# Patient Record
Sex: Male | Born: 1940 | Race: White | Hispanic: No | Marital: Married | Smoking: Never smoker
Health system: Southern US, Community
[De-identification: ages and names within clinical notes are randomized; demographics above are authoritative.]

## PROBLEM LIST (undated history)

## (undated) DIAGNOSIS — C4491 Basal cell carcinoma of skin, unspecified: Secondary | ICD-10-CM

## (undated) DIAGNOSIS — M1990 Unspecified osteoarthritis, unspecified site: Secondary | ICD-10-CM

## (undated) DIAGNOSIS — E119 Type 2 diabetes mellitus without complications: Secondary | ICD-10-CM

## (undated) DIAGNOSIS — I10 Essential (primary) hypertension: Secondary | ICD-10-CM

## (undated) DIAGNOSIS — E7800 Pure hypercholesterolemia, unspecified: Secondary | ICD-10-CM

## (undated) DIAGNOSIS — G459 Transient cerebral ischemic attack, unspecified: Secondary | ICD-10-CM

## (undated) DIAGNOSIS — N189 Chronic kidney disease, unspecified: Secondary | ICD-10-CM

## (undated) DIAGNOSIS — D126 Benign neoplasm of colon, unspecified: Secondary | ICD-10-CM

## (undated) DIAGNOSIS — C61 Malignant neoplasm of prostate: Secondary | ICD-10-CM

## (undated) DIAGNOSIS — IMO0002 Reserved for concepts with insufficient information to code with codable children: Secondary | ICD-10-CM

## (undated) HISTORY — DX: Basal cell carcinoma of skin, unspecified: C44.91

## (undated) HISTORY — DX: Benign neoplasm of colon, unspecified: D12.6

## (undated) HISTORY — PX: OTHER SURGICAL HISTORY: SHX169

## (undated) HISTORY — PX: EYE SURGERY: SHX253

## (undated) HISTORY — PX: PROSTATECTOMY: SHX69

---

## 2006-06-22 ENCOUNTER — Ambulatory Visit: Payer: Self-pay | Admitting: Rheumatology

## 2006-09-08 ENCOUNTER — Ambulatory Visit: Payer: Self-pay | Admitting: Unknown Physician Specialty

## 2007-11-19 ENCOUNTER — Ambulatory Visit: Payer: Self-pay | Admitting: Internal Medicine

## 2008-04-03 ENCOUNTER — Ambulatory Visit: Payer: Self-pay | Admitting: Internal Medicine

## 2008-05-29 ENCOUNTER — Ambulatory Visit: Payer: Self-pay | Admitting: Rheumatology

## 2008-06-05 ENCOUNTER — Ambulatory Visit (HOSPITAL_COMMUNITY): Admission: RE | Admit: 2008-06-05 | Discharge: 2008-06-05 | Payer: Self-pay | Admitting: Neurosurgery

## 2009-10-20 ENCOUNTER — Ambulatory Visit: Payer: Self-pay | Admitting: Internal Medicine

## 2010-04-16 ENCOUNTER — Ambulatory Visit: Payer: Self-pay | Admitting: Unknown Physician Specialty

## 2011-01-18 ENCOUNTER — Encounter: Payer: Self-pay | Admitting: Physician Assistant

## 2011-02-05 ENCOUNTER — Encounter: Payer: Self-pay | Admitting: Physician Assistant

## 2011-03-07 ENCOUNTER — Encounter: Payer: Self-pay | Admitting: Physician Assistant

## 2011-08-26 ENCOUNTER — Other Ambulatory Visit: Payer: Self-pay | Admitting: Rheumatology

## 2011-08-26 LAB — SYNOVIAL CELL COUNT + DIFF, W/ CRYSTALS
Lymphocytes: 33 %
Neutrophils: 30 %
Nucleated Cell Count: 249 /mm3
Other Cells BF: 0 %

## 2011-09-17 ENCOUNTER — Ambulatory Visit: Payer: Self-pay | Admitting: Rheumatology

## 2012-09-11 ENCOUNTER — Ambulatory Visit: Payer: Self-pay | Admitting: Otolaryngology

## 2012-09-11 DIAGNOSIS — I10 Essential (primary) hypertension: Secondary | ICD-10-CM

## 2012-09-11 LAB — CBC WITH DIFFERENTIAL/PLATELET
Lymphocyte %: 21.6 %
MCHC: 33.6 g/dL (ref 32.0–36.0)
Neutrophil #: 2.2 10*3/uL (ref 1.4–6.5)
Neutrophil %: 64.8 %
RBC: 5.16 10*6/uL (ref 4.40–5.90)
RDW: 13.4 % (ref 11.5–14.5)

## 2012-09-11 LAB — BASIC METABOLIC PANEL
BUN: 24 mg/dL — ABNORMAL HIGH (ref 7–18)
Calcium, Total: 8.8 mg/dL (ref 8.5–10.1)
Creatinine: 1.43 mg/dL — ABNORMAL HIGH (ref 0.60–1.30)
EGFR (Non-African Amer.): 49 — ABNORMAL LOW
Osmolality: 285 (ref 275–301)

## 2012-09-12 ENCOUNTER — Ambulatory Visit: Payer: Self-pay | Admitting: Otolaryngology

## 2013-10-23 DIAGNOSIS — N182 Chronic kidney disease, stage 2 (mild): Secondary | ICD-10-CM

## 2013-10-23 DIAGNOSIS — E7800 Pure hypercholesterolemia, unspecified: Secondary | ICD-10-CM | POA: Insufficient documentation

## 2013-10-23 DIAGNOSIS — E1122 Type 2 diabetes mellitus with diabetic chronic kidney disease: Secondary | ICD-10-CM | POA: Insufficient documentation

## 2013-10-23 DIAGNOSIS — I10 Essential (primary) hypertension: Secondary | ICD-10-CM | POA: Insufficient documentation

## 2014-09-26 NOTE — Op Note (Signed)
PATIENT NAME:  James Watson, James Watson MR#:  [id number] DATE OF BIRTH:  Oct 30, 1940  DATE OF PROCEDURE:  09/12/2012  PREOPERATIVE DIAGNOSIS: Left conductive hearing loss.   POSTOPERATIVE DIAGNOSIS: Left conductive hearing loss.    PROCEDURE: Left middle ear exploration with tympanoplasty and ossicular chain reconstruction.   SURGEON: Malon Kindle, MD   ANESTHESIA: General endotracheal.   INDICATIONS: The 74 year old male with a history of a previous left ear surgery of uncertain extent many years ago. 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   FINDINGS: Middle ear was explored and  was found to have erosion of the incus, but an intact and mobile stapes. I suspect they previously attempted a type 3 tympanoplasty which had failed. The ossicular chain was reconstructed with a with a [employer] prosthesis, catalogue [phone].  COMPLICATIONS: None.   DESCRIPTION OF PROCEDURE: After informed consent, the patient was taken to the operating room and placed in the supine position. After induction of general endotracheal anesthesia, the left ear was injected in the canal and post-auricularly with 1% lidocaine with epinephrine 1:200,000.  He was then prepped and draped in the usual sterile fashion. Left ear was then evaluated under the operating microscope. Canal flap was made, incising the canal skin at 12 o'clock and 6 o'clock and carrying the incision out 4 to 5 mm away from the annulus. A horizontal incision was made connecting the 2 vertical incisions and the canal flap was elevated down to the annulus. The annulus was carefully elevated with a Rosen needle. I watched carefully for the chorda tympani nerve, but no nerve was identified and the likelihood been likely had been sacrificed during the previous surgery. There were some adhesions which were carefully taken down with incudostapedial joint knife, but immediately noted that the  stapes was in place with no evidence of any previous prosthesis. Residual nub of the residual incus could be seen up towards the attic. The malleus was intact. After taking the adhesions down, the stapes was palpated and inspected for mobility and appeared to be fully mobile. A [employer]us prosthesis was selected and trimmed to fit after measuring the depth to the shoulder of the stapes. In attempting to place the prosthesis, the tympanic membrane in that area was very thin and developed a small tear while trying to push the prosthesis up under the malleus. A small amount of perichondrium was harvested from just inside the tragus, pressed and dried and then placed to attach the small perforation, and the incus prosthesis was then placed into position, sliding it up under the malleus and seating it over the head of the stapes. The malleus was palpated and good movement of the prosthesis and stapes unit was noted. The tympanic membrane graft was held  into position by the prosthesis itself and no middle ear packing was necessary. The perforation was quite small, maybe 2 mm. The tympanic membrane and the posterior canal flap were placed back in to anatomic position and secured with Floxin moistened Gelfoam packing, followed by bacitracin ointment. The tragal wound was closed with 5-0 fast gut suture in an interrupted fashion. The patient was then returned to the anesthesiologist for awakening. He was awakened and taken to the recovery room in good condition postoperatively.   BLOOD LOSS:  Minimal.     ____________________________ Sammuel Hines. Richardson Landry, MD psb:cc D: 09/12/2012 14:48:17 ET T: 09/12/2012 16:48:59 ET JOB#: [id number]  cc: Sammuel Hines. Richardson Landry, MD, <Dictator> Sammuel Hines  Shane Badeaux MD ELECTRONICALLY SIGNED 09/19/2012 8:35

## 2014-10-10 ENCOUNTER — Other Ambulatory Visit: Payer: Self-pay | Admitting: Nephrology

## 2014-10-10 DIAGNOSIS — N184 Chronic kidney disease, stage 4 (severe): Secondary | ICD-10-CM

## 2014-10-16 ENCOUNTER — Ambulatory Visit
Admission: RE | Admit: 2014-10-16 | Discharge: 2014-10-16 | Disposition: A | Discharge status: Home / Self Care | Payer: Medicare Other | Source: Ambulatory Visit | Attending: Nephrology | Admitting: Nephrology

## 2014-10-16 DIAGNOSIS — N184 Chronic kidney disease, stage 4 (severe): Secondary | ICD-10-CM

## 2014-10-16 DIAGNOSIS — N281 Cyst of kidney, acquired: Secondary | ICD-10-CM | POA: Insufficient documentation

## 2015-02-27 ENCOUNTER — Other Ambulatory Visit: Payer: Self-pay | Admitting: Family Medicine

## 2015-02-27 DIAGNOSIS — M25512 Pain in left shoulder: Secondary | ICD-10-CM

## 2015-03-04 ENCOUNTER — Ambulatory Visit
Admission: RE | Admit: 2015-03-04 | Discharge: 2015-03-04 | Disposition: A | Discharge status: Home / Self Care | Payer: Medicare Other | Source: Ambulatory Visit | Attending: Family Medicine | Admitting: Family Medicine

## 2015-03-04 DIAGNOSIS — M25512 Pain in left shoulder: Secondary | ICD-10-CM

## 2015-03-07 ENCOUNTER — Other Ambulatory Visit: Payer: Medicare Other

## 2015-05-25 ENCOUNTER — Encounter: Payer: Medicare Other | Attending: Family Medicine | Admitting: Dietician

## 2015-05-25 VITALS — Ht 65.0 in | Wt 175.0 lb

## 2015-05-25 DIAGNOSIS — N289 Disorder of kidney and ureter, unspecified: Secondary | ICD-10-CM | POA: Diagnosis not present

## 2015-05-25 DIAGNOSIS — N182 Chronic kidney disease, stage 2 (mild): Secondary | ICD-10-CM

## 2015-05-25 DIAGNOSIS — E119 Type 2 diabetes mellitus without complications: Secondary | ICD-10-CM | POA: Insufficient documentation

## 2015-05-25 DIAGNOSIS — E785 Hyperlipidemia, unspecified: Secondary | ICD-10-CM | POA: Diagnosis present

## 2015-05-25 DIAGNOSIS — E1122 Type 2 diabetes mellitus with diabetic chronic kidney disease: Secondary | ICD-10-CM

## 2015-05-25 DIAGNOSIS — I10 Essential (primary) hypertension: Secondary | ICD-10-CM | POA: Insufficient documentation

## 2015-05-25 NOTE — Patient Instructions (Signed)
Balance meals with protein, 2-4 servings of carbohydrate and "free vegetables". Estimate carbohydrate servings at meals and snacks. Spread 11 servings of carbohydrate over 3 meals and 1-2 snacks. Measure some portions of carbohydrate foods, especially starches. Read labels for carbohydrate grams remembering that 15 gms of carbohydrate equals one serving. Limit fruit to 1 (15gm) serving per meal. Limit sodium to 1500 mg per day or an average of no more than 500mg  per meal. Protein recommendations- approximately 6 oz. of a protein food/day. Refer to list.

## 2015-05-26 NOTE — Progress Notes (Signed)
Medical Nutrition Therapy: Visit start time: [id number]  end time: 1650 Assessment:  Diagnosis: Type 2 diabetes, hypertension, hyperlipidemia, renal disease Past medical history: shoulder surgery 2 weeks ago  Psychosocial issues/ stress concerns: Patient rates his stress level as "low" and indicates "very well" as to how he is dealing with his stress. Preferred learning method:  James Watson . Hands-on Current weight: 175 lbs Height: 65 in  Medications, supplements: see list  Progress and evaluation:  Patient in for initial nutrition assessment. Patient reports that he made significant positive diet changes approximately 1 year ago and his HgA1c improved but in past 4 months A1c has increased from 6.7 to 7.3. His GFR has decreased from 50 to 42 in past year. Regarding his lipid panel (12/'16), LDL was less than 100 (95) and total cholesterol was less than 200 (176) but triglycerides were 160.  Patient reports his fasting blood sugars range from 140's to 180's. He began taking glimepiride approx. 2 weeks ago. He eats 3 meals per day with very little snacking between meals. Reports that his weight had remained relatively stable in past year but has increased recently. Reports that his wife prepares meals and is "health conscious"; most meats are baked or grilled; dinner meals include a variety of vegetables. He and wife typically share entrees when dining "out".  He reports he has become more conscious of sodium intake and does not add any salt to foods. He does not drink sugar sweetened beverages and loves sweets but limits his intake Physical activity: decreased physical activity in recent weeks with shoulder pain and subsequent surgery 2 weeks ago.   Dietary Intake:  Usual eating pattern includes 3 meals and 0-1 snacks per day. Dining out frequency: 2-4 meals per week.  Breakfast: 1/2 large bagel or 1/2 English muffin, 1 egg, 2 strips bacon or 1 sausage pattie, large bowl of fruit or large bowl of  cereal/walnuts/skim milk and large bowl of fruit Snack: occasionally handful of cheese crackers Lunch: 12:00- salad with scoop of chicken salad, diet soda or 1/2 ham or [place] sandwich with a salad or when "out" a chicken taco or fish taco Supper: grilled chicken or pork chop, 1/2 sweet potato or occasionally 1/2 white potato with salad or other vegetables such as broccoli or turnip greens or asparagus, hot tea. Typically eats a serving of fruit rather than dessert. Prior to diet changes 1 year ago, would eat a dessert after every dinner meal. Beverages: water, hot tea, diet soda  Nutrition Care Education: Diabetes:  Instructed on a meal plan based on 1600 calories including carbohydrate counting, discussing the balance of carbohydrate foods, protein and non-starchy vegetables. Discussed portion control and basic label reading. Gave and reviewed menu examples. Renal disease:  Instructed on guidelines for sodium recommendations and ways to lower sodium in diet. Also, discussed the need for adequate protein, yet not excessive to help protect kidneys.  Nutritional Diagnosis:  NB-2.1 Physical inactivity As related to shoulder pain and subsequent surgery.  As evidenced by diet/activity history.  Patient's meal pattern and choices have remained relatively consistent over the past year but activity has decreased which is possibly contributing to some weight gain and elevated glucose.  Intervention:  Balance meals with protein, 2-4 servings of carbohydrate and "free vegetables". Estimate carbohydrate servings at meals and snacks. Spread 11 servings of carbohydrate over 3 meals and 1-2 snacks. Measure some portions of carbohydrate foods, especially starches. Read labels for carbohydrate grams remembering that 15 gms of carbohydrate equals one  serving. Limit fruit to 1 (15gm) serving per meal. Limit sodium to 1500 mg per day or an average of no more than 500mg  per meal. Protein recommendations-  approximately 6 oz. of a protein food/day. Ref Product/process development scientist given:  . Food lists/ Planning A Balanced Meal . Sample meal pattern/ menus . Goals/ instructions  Learner/ who was taught:  . Patient  Level of understanding: . Partial understanding; needs review/ practice Learning barriers: . None Willingness to learn/ readiness for change: . Acceptance, ready for change  Monitoring and Evaluation:  Dietary intake, exercise,  and body weight      follow up: 06/23/15 at 3:30

## 2015-06-18 DIAGNOSIS — D126 Benign neoplasm of colon, unspecified: Secondary | ICD-10-CM

## 2015-06-18 DIAGNOSIS — Z86010 Personal history of colonic polyps: Secondary | ICD-10-CM | POA: Insufficient documentation

## 2015-06-18 HISTORY — DX: Benign neoplasm of colon, unspecified: D12.6

## 2015-06-23 ENCOUNTER — Encounter: Payer: Medicare Other | Attending: Family Medicine | Admitting: Dietician

## 2015-06-23 ENCOUNTER — Encounter: Payer: Self-pay | Admitting: Dietician

## 2015-06-23 VITALS — Ht 65.0 in | Wt 174.2 lb

## 2015-06-23 DIAGNOSIS — E119 Type 2 diabetes mellitus without complications: Secondary | ICD-10-CM | POA: Diagnosis not present

## 2015-06-23 DIAGNOSIS — E785 Hyperlipidemia, unspecified: Secondary | ICD-10-CM

## 2015-06-23 DIAGNOSIS — N289 Disorder of kidney and ureter, unspecified: Secondary | ICD-10-CM | POA: Insufficient documentation

## 2015-06-23 DIAGNOSIS — I10 Essential (primary) hypertension: Secondary | ICD-10-CM | POA: Insufficient documentation

## 2015-06-23 NOTE — Patient Instructions (Signed)
Continue with previous goals: Balance meals with protein, 2-4 servings of carbohydrate and "free vegetables". Estimate carbohydrate servings at meals and snacks. Spread 11 servings of carbohydrate over 3 meals and 1-2 snacks. Measure some portions of carbohydrate foods, especially starches. Read labels for carbohydrate grams remembering that 15 gms of carbohydrate equals one serving. Limit fruit to 1 (15gm) serving per meal. Limit sodium to 1500 mg per day or an average of no more than 500mg  per meal. Protein recommendations- approximately 6 oz. of a protein food/day.   Suggested to use calorieking app to look up nutrition information when dining "out".

## 2015-06-23 NOTE — Progress Notes (Signed)
Medical Nutrition Therapy Follow-up visit:  Time with patient:  15:30-16:30 Visit #:2 ASSESSMENT:  Diagnosis:Type 2 diabetes, hypertension, hyperlipidemia, renal disease  Current weight:174.2 lbs    Height:65 in Medications: See list Progress and evaluation: Patient accompanied by his wife in for nutrition follow-up visit. He has followed through to better control portions. At breakfast he is substituting oatmeal with walnuts on some mornings in place of cereal and milk. He is limiting fruit to 1 cup of mixed fruits vs. previous 2 cups+ at breakfast. He also reports he is being more conscious of carbohydrate servings. He also is reading labels for sodium content. His beverages continue to be water (drinks throughout day), hot tea, 2 (12oz) cans of diet soda/day and 2 (4oz) glasses of wine/day. His diet continues to be low in saturated and trans fat.  He reports that FBG's are in 120's to 130's range vs. 140's to 180's range. He also was able to keep weight stable over the holidays, weighing 1 lb less than at previous visit. He has also been able to increase exercise. He does stretches daily and walks on treadmill for 45 minutes, 3-4 days/week.   NUTRITION CARE EDUCATION: Diabetes:  Commended on the overall positive changes in diet and exercise and the improvements in his fasting glucose. Reviewed his typical meal pattern, answering he and wife's questions regarding specific foods and portions.Also, discussed making healthy choices when dining "out". Reviewed again the role of diet and exercise in glucose control.  Renal disease and hypertension:Reviewed sodium guidelines discussing how to balance higher sodium foods with low sodium foods. Also, discussed importance of fruits/vegetables in blood pressure control.  INTERVENTION:  Continue with previous goals: Balance meals with protein, 2-4 servings of carbohydrate and "free vegetables". Estimate carbohydrate servings at meals and  snacks. Spread 11 servings of carbohydrate over 3 meals and 1-2 snacks. Measure some portions of carbohydrate foods, especially starches. Read labels for carbohydrate grams remembering that 15 gms of carbohydrate equals one serving. Limit fruit to 1 (15gm) serving per meal. Limit sodium to 1500 mg per day or an average of no more than 500mg  per meal. Protein recommendations- approximately 6 oz. of a protein food/day.   Suggested to use calorieking app to look up nutrition information when dining "out".  EDUCATION MATERIALS GIVEN:  . Goals/ instructions  LEARNER/ who was taught:  . Patient  . Spouse/ partner  LEVEL OF UNDERSTANDING: . Verbalizes/ demonstrates competency LEARNING BARRIERS: . None  WILLINGNESS TO LEARN/READINESS FOR CHANGE: . Eager, change in progress MONITORING AND EVALUATION:  No follow-up was scheduled at this time. Encouraged he and/or wife to call if has further questions or desires further help with diet/nutrition.

## 2015-07-15 ENCOUNTER — Encounter: Payer: Self-pay | Admitting: *Deleted

## 2015-07-16 ENCOUNTER — Ambulatory Visit
Admission: RE | Admit: 2015-07-16 | Discharge: 2015-07-16 | Disposition: A | Discharge status: Home / Self Care | Payer: Medicare Other | Source: Ambulatory Visit | Attending: Unknown Physician Specialty | Admitting: Unknown Physician Specialty

## 2015-07-16 ENCOUNTER — Ambulatory Visit: Payer: Medicare Other | Admitting: Anesthesiology

## 2015-07-16 ENCOUNTER — Encounter
Admission: RE | Disposition: A | Discharge status: Home / Self Care | Payer: Self-pay | Source: Ambulatory Visit | Attending: Unknown Physician Specialty

## 2015-07-16 DIAGNOSIS — Z886 Allergy status to analgesic agent status: Secondary | ICD-10-CM | POA: Insufficient documentation

## 2015-07-16 DIAGNOSIS — N189 Chronic kidney disease, unspecified: Secondary | ICD-10-CM | POA: Diagnosis not present

## 2015-07-16 DIAGNOSIS — Z1211 Encounter for screening for malignant neoplasm of colon: Secondary | ICD-10-CM | POA: Diagnosis present

## 2015-07-16 DIAGNOSIS — Z85828 Personal history of other malignant neoplasm of skin: Secondary | ICD-10-CM | POA: Insufficient documentation

## 2015-07-16 DIAGNOSIS — K640 First degree hemorrhoids: Secondary | ICD-10-CM | POA: Diagnosis not present

## 2015-07-16 DIAGNOSIS — E119 Type 2 diabetes mellitus without complications: Secondary | ICD-10-CM | POA: Diagnosis not present

## 2015-07-16 DIAGNOSIS — I129 Hypertensive chronic kidney disease with stage 1 through stage 4 chronic kidney disease, or unspecified chronic kidney disease: Secondary | ICD-10-CM | POA: Insufficient documentation

## 2015-07-16 DIAGNOSIS — Z888 Allergy status to other drugs, medicaments and biological substances status: Secondary | ICD-10-CM | POA: Diagnosis not present

## 2015-07-16 DIAGNOSIS — D123 Benign neoplasm of transverse colon: Secondary | ICD-10-CM | POA: Insufficient documentation

## 2015-07-16 DIAGNOSIS — M1990 Unspecified osteoarthritis, unspecified site: Secondary | ICD-10-CM | POA: Diagnosis not present

## 2015-07-16 DIAGNOSIS — Z885 Allergy status to narcotic agent status: Secondary | ICD-10-CM | POA: Insufficient documentation

## 2015-07-16 DIAGNOSIS — Z8546 Personal history of malignant neoplasm of prostate: Secondary | ICD-10-CM | POA: Insufficient documentation

## 2015-07-16 DIAGNOSIS — Z8673 Personal history of transient ischemic attack (TIA), and cerebral infarction without residual deficits: Secondary | ICD-10-CM | POA: Diagnosis not present

## 2015-07-16 DIAGNOSIS — E7800 Pure hypercholesterolemia, unspecified: Secondary | ICD-10-CM | POA: Diagnosis not present

## 2015-07-16 DIAGNOSIS — Z7982 Long term (current) use of aspirin: Secondary | ICD-10-CM | POA: Insufficient documentation

## 2015-07-16 DIAGNOSIS — Z79899 Other long term (current) drug therapy: Secondary | ICD-10-CM | POA: Diagnosis not present

## 2015-07-16 HISTORY — DX: Unspecified osteoarthritis, unspecified site: M19.90

## 2015-07-16 HISTORY — DX: Reserved for concepts with insufficient information to code with codable children: IMO0002

## 2015-07-16 HISTORY — DX: Type 2 diabetes mellitus without complications: E11.9

## 2015-07-16 HISTORY — DX: Malignant neoplasm of prostate: C61

## 2015-07-16 HISTORY — PX: COLONOSCOPY WITH PROPOFOL: SHX5780

## 2015-07-16 HISTORY — DX: Basal cell carcinoma of skin, unspecified: C44.91

## 2015-07-16 HISTORY — DX: Pure hypercholesterolemia, unspecified: E78.00

## 2015-07-16 HISTORY — DX: Chronic kidney disease, unspecified: N18.9

## 2015-07-16 HISTORY — DX: Transient cerebral ischemic attack, unspecified: G45.9

## 2015-07-16 HISTORY — DX: Essential (primary) hypertension: I10

## 2015-07-16 LAB — GLUCOSE, CAPILLARY: Glucose-Capillary: 127 mg/dL — ABNORMAL HIGH (ref 65–99)

## 2015-07-16 SURGERY — COLONOSCOPY WITH PROPOFOL
Anesthesia: General

## 2015-07-16 MED ORDER — PROPOFOL 10 MG/ML IV BOLUS
INTRAVENOUS | Status: DC | PRN
  Administered 2015-07-16 (×3): 50 mg via INTRAVENOUS
  Administered 2015-07-16: 20 mg via INTRAVENOUS

## 2015-07-16 MED ORDER — SODIUM CHLORIDE 0.9 % IV SOLN: INTRAVENOUS | Status: DC

## 2015-07-16 MED ORDER — SODIUM CHLORIDE 0.9 % IV SOLN
INTRAVENOUS | Status: DC
  Administered 2015-07-16: 07:00:00 via INTRAVENOUS
  Administered 2015-07-16: 1000 mL via INTRAVENOUS

## 2015-07-16 NOTE — Transfer of Care (Signed)
Immediate Anesthesia Transfer of Care Note  Patient: James Watson  Procedure(s) Performed: Procedure(s): COLONOSCOPY WITH PROPOFOL (N/A)  Patient Location: PACU  Anesthesia Type:General  Level of Consciousness: awake, alert  and oriented  Airway & Oxygen Therapy: Patient Spontanous Breathing and Patient connected to nasal cannula oxygen  Post-op Assessment: Report given to RN and Post -op Vital signs reviewed and stable  Post vital signs: Reviewed and stable  Last Vitals:  Filed Vitals:   07/16/15 0800 07/16/15 0810  BP: 100/69 100/69  Pulse:  50  Temp: 35.8 C 35.8 C  Resp:  14    Complications: No apparent anesthesia complications

## 2015-07-16 NOTE — H&P (Signed)
Primary Care Physician:  Dion Body, MD Primary Gastroenterologist:  Dr. Vira Agar  Pre-Procedure History & Physical: HPI:  James Watson is a 75 y.o. male is here for an colonoscopy.   Past Medical History  Diagnosis Date  . Diabetes mellitus without complication ([place])   . Hypertension   . Hypercholesteremia   . Basal cell carcinoma   . Chronic renal insufficiency   . Prostate cancer ([place])   . Osteoarthritis   . 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 TIA (transient ischemic attack)     Past Surgical History  Procedure Laterality Date  . Prostatectomy    . Tm repair    . Mtp repair    . Left shoulder arthroscopy      Prior to Admission medications   Medication Sig Start Date End Date Taking? Authorizing Provider  amLODipine (NORVASC) 5 MG tablet TAKE 1 TABLET (5 MG TOTAL) BY MOUTH ONCE DAILY. 04/10/15   Historical Provider, MD  aspirin EC 81 MG tablet Take by mouth.    Historical Provider, MD  atorvastatin (LIPITOR) 80 MG tablet TAKE 1/2 TABLET BY MOUTH EVERY NIGHT 04/09/15   Historical Provider, MD  Blood Glucose Monitoring Suppl (ONE TOUCH ULTRA 2) W/DEVICE KIT USE AS DIRECTED. DX: E11.22, N18.2 05/14/15   Historical Provider, MD  celecoxib (CELEBREX) 100 MG capsule Take by mouth. Reported on 07/15/2015 03/26/14   Historical Provider, MD  Cholecalciferol (VITAMIN D-1000 MAX ST) 1000 UNITS tablet Take by mouth.    Historical Provider, MD  glimepiride (AMARYL) 1 MG tablet TAKE 1 TABLET (1 MG TOTAL) BY MOUTH DAILY WITH BREAKFAST. 05/14/15   Historical Provider, MD  losartan (COZAAR) 100 MG tablet TAKE 1 TABLET (100 MG TOTAL) BY MOUTH ONCE DAILY. 03/13/15   Historical Provider, MD  ONE TOUCH ULTRA TEST test strip USE AS DIRECTED. CHECK CBG'S FASTING ONCE DAILY. DX: E11.22, N18.2 05/14/15   Historical Provider, MD  Jonetta Speak LANCETS FINE MISC Use 1 Units as directed. Check CBG's fasting once daily. Dx: E11.22, N18.2 05/14/15   Historical Provider, MD    Allergies as of  06/23/2015 - Review Complete 06/23/2015  Allergen Reaction Noted  . Enalapril maleate Other (See Comments) 05/26/2015  . Hydrocodone Other (See Comments) 05/26/2015  . Hydrocodone-acetaminophen Nausea Only 05/26/2015    History reviewed. No pertinent family history.  Social History   Social History  . Marital Status: Married    Spouse Name: N/A  . Number of Children: N/A  . Years of Education: N/A   Occupational History  . Not on file.   Social History Main Topics  . Smoking status: Never Smoker   . Smokeless tobacco: Not on file  . Alcohol Use: 1.2 oz/week    2 Glasses of wine per week  . Drug Use: Not on file  . Sexual Activity: Not on file   Other Topics Concern  . Not on file   Social History Narrative    Review of Systems: See HPI, otherwise negative ROS  Physical Exam: BP 154/83 mmHg  Pulse 64  Temp(Src) 96.9 F (36.1 C) (Tympanic)  Resp 17  Ht '5\' 6"'$  (1.676 m)  Wt 75.751 kg (167 lb)  BMI 26.97 kg/m2  SpO2 99% General:   Alert,  pleasant and cooperative in NAD Head:  Normocephalic and atraumatic. Neck:  Supple; no masses or thyromegaly. Lungs:  Clear throughout to auscultation.    Heart:  Regular rate and rhythm. Abdomen:  Soft, nontender and nondistended. Normal bowel sounds, without  guarding, and without rebound.   Neurologic:  Alert and  oriented x4;  grossly normal neurologically.  Impression/Plan: James Watson is here for an colonoscopy to be performed for [facility] colon polyps  Risks, benefits, limitations, and alternatives regarding  colonoscopy have been reviewed with the patient.  Questions have been answered.  All parties agreeable.   Gaylyn Cheers, MD  07/16/2015, 7:22 AM

## 2015-07-16 NOTE — Anesthesia Preprocedure Evaluation (Addendum)
Anesthesia Evaluation  Patient identified by MRN, date of birth, ID band Patient awake    Reviewed: Allergy & Precautions, H&P , NPO status , Patient's Chart, lab work & pertinent test results  History of Anesthesia Complications Negative for: history of anesthetic complications  Airway Mallampati: III  TM Distance: >3 FB Neck ROM: limited    Dental  (+) Poor Dentition, Chipped   Pulmonary neg pulmonary ROS, neg shortness of breath,    Pulmonary exam normal breath sounds clear to auscultation       Cardiovascular Exercise Tolerance: Good hypertension, (-) angina(-) Past MI and (-) DOE Normal cardiovascular exam Rhythm:regular Rate:Normal     Neuro/Psych TIAnegative psych ROS   GI/Hepatic negative GI ROS, Neg liver ROS,   Endo/Other  diabetes, Type 2  Renal/GU Renal disease  negative genitourinary   Musculoskeletal  (+) Arthritis ,   Abdominal   Peds  Hematology negative hematology ROS (+)   Anesthesia Other Findings Past Medical History:   Diabetes mellitus without complication (H[facility])                 Hypertension                                                 Hypercholesteremia                                           Basal cell carcinoma                                         Chronic renal insufficiency                                  Prostate cancer (HCC)                                        Osteoarthritis                                               Squamous cell carcinoma (HCC)                                TIA (transient ischemic attack)                             Past Surgical History:   PROSTATECTOMY                                                 tm repair  mtp repair                                                    left shoulder arthroscopy                                    BMI    Body Mass Index   26.96 kg/m 2      Reproductive/Obstetrics negative OB ROS                            Anesthesia Physical Anesthesia Plan  ASA: III  Anesthesia Plan: General   Post-op Pain Management:    Induction:   Airway Management Planned:   Additional Equipment:   Intra-op Plan:   Post-operative Plan:   Informed Consent: I have reviewed the patients History and Physical, chart, labs and discussed the procedure including the risks, benefits and alternatives for the proposed anesthesia with the patient or authorized representative who has indicated his/her understanding and acceptance.   Dental Advisory Given  Plan Discussed with: Anesthesiologist, CRNA and Surgeon  Anesthesia Plan Comments:         Anesthesia Quick Evaluation

## 2015-07-16 NOTE — Anesthesia Postprocedure Evaluation (Signed)
Anesthesia Post Note  Patient: James Watson  Procedure(s) Performed: Procedure(s) (LRB): COLONOSCOPY WITH PROPOFOL (N/A)  Patient location during evaluation: Endoscopy Anesthesia Type: General Level of consciousness: awake and alert Pain management: pain level controlled Vital Signs Assessment: post-procedure vital signs reviewed and stable Respiratory status: spontaneous breathing, nonlabored ventilation, respiratory function stable and patient connected to nasal cannula oxygen Cardiovascular status: blood pressure returned to baseline and stable Postop Assessment: no signs of nausea or vomiting Anesthetic complications: no    Last Vitals:  Filed Vitals:   07/16/15 0828 07/16/15 0840  BP: 129/80 145/91  Pulse: 53 51  Temp:    Resp: 16 16    Last Pain: There were no vitals filed for this visit.               Precious Haws Jelisa [place]

## 2015-07-16 NOTE — Op Note (Signed)
[facility] Gastroenterology Patient Name: James Watson Procedure Date: 07/16/2015 7:26 AM MRN: [id number] Account #: [id number] Date of Birth: 10/15/1940 Admit Type: Outpatient Age: 75 Room: [facility] ENDO ROOM 1 Gender: Male Note Status: Finalized Procedure:         Colonoscopy Indications:       High risk colon cancer surveillance: Personal history of                     colonic polyps Providers:         Manya Silvas, MD Referring MD:      Dion Body (Referring MD) Medicines:         Propofol per Anesthesia Complications:     No immediate complications. Procedure:         Pre-Anesthesia Assessment:                    - After reviewing the risks and benefits, the patient was                     deemed in satisfactory condition to undergo the procedure.                    After obtaining informed consent, the colonoscope was                     passed under direct vision. Throughout the procedure, the                     patient's blood pressure, pulse, and oxygen saturations                     were monitored continuously. The Colonoscope was                     introduced through the anus and advanced to the the cecum,                     identified by appendiceal orifice and ileocecal valve. The                     colonoscopy was performed without difficulty. The patient                     tolerated the procedure well. The quality of the bowel                     preparation was excellent. Findings:      Three sessile polyps were found at the hepatic flexure. The polyps were       diminutive in size. These polyps were removed with a jumbo cold forceps.       Resection and retrieval were complete.      Internal hemorrhoids were found during endoscopy. The hemorrhoids were       small and Grade I (internal hemorrhoids that do not prolapse).      The exam was otherwise without abnormality. Impression:        - Three diminutive polyps at the hepatic  flexure. Resected                     and retrieved.                    - Internal hemorrhoids.                    -  The examination was otherwise normal. Recommendation:    - Await pathology results. Manya Silvas, MD 07/16/2015 8:03:38 AM This report has been signed electronically. Number of Addenda: 0 Note Initiated On: 07/16/2015 7:26 AM Scope Withdrawal Time: 0 hours 11 minutes 32 seconds  Total Procedure Duration: 0 hours 17 minutes 40 seconds       [facility]

## 2015-07-17 LAB — SURGICAL PATHOLOGY

## 2016-04-06 ENCOUNTER — Inpatient Hospital Stay: Payer: Medicare Other | Attending: Internal Medicine | Admitting: Internal Medicine

## 2016-04-06 ENCOUNTER — Encounter (INDEPENDENT_AMBULATORY_CARE_PROVIDER_SITE_OTHER): Payer: Self-pay

## 2016-04-06 ENCOUNTER — Inpatient Hospital Stay: Payer: Medicare Other

## 2016-04-06 DIAGNOSIS — Z79899 Other long term (current) drug therapy: Secondary | ICD-10-CM | POA: Insufficient documentation

## 2016-04-06 DIAGNOSIS — Z7982 Long term (current) use of aspirin: Secondary | ICD-10-CM | POA: Diagnosis not present

## 2016-04-06 DIAGNOSIS — E119 Type 2 diabetes mellitus without complications: Secondary | ICD-10-CM | POA: Insufficient documentation

## 2016-04-06 DIAGNOSIS — I129 Hypertensive chronic kidney disease with stage 1 through stage 4 chronic kidney disease, or unspecified chronic kidney disease: Secondary | ICD-10-CM | POA: Insufficient documentation

## 2016-04-06 DIAGNOSIS — D696 Thrombocytopenia, unspecified: Secondary | ICD-10-CM

## 2016-04-06 DIAGNOSIS — D72819 Decreased white blood cell count, unspecified: Secondary | ICD-10-CM | POA: Diagnosis not present

## 2016-04-06 DIAGNOSIS — N183 Chronic kidney disease, stage 3 (moderate): Secondary | ICD-10-CM | POA: Insufficient documentation

## 2016-04-06 DIAGNOSIS — Z8673 Personal history of transient ischemic attack (TIA), and cerebral infarction without residual deficits: Secondary | ICD-10-CM | POA: Insufficient documentation

## 2016-04-06 LAB — VITAMIN B12: VITAMIN B 12: 248 pg/mL (ref 180–914)

## 2016-04-06 LAB — CBC WITH DIFFERENTIAL/PLATELET
BASOS ABS: 0 10*3/uL (ref 0–0.1)
Basophils Relative: 1 %
EOS PCT: 3 %
Eosinophils Absolute: 0.1 10*3/uL (ref 0–0.7)
HCT: 49.2 % (ref 40.0–52.0)
Hemoglobin: 16.5 g/dL (ref 13.0–18.0)
LYMPHS PCT: 19 %
Lymphs Abs: 0.8 10*3/uL — ABNORMAL LOW (ref 1.0–3.6)
MCH: 31.7 pg (ref 26.0–34.0)
MCHC: 33.6 g/dL (ref 32.0–36.0)
MCV: 94.3 fL (ref 80.0–100.0)
Monocytes Absolute: 0.4 10*3/uL (ref 0.2–1.0)
Monocytes Relative: 10 %
Neutro Abs: 2.8 10*3/uL (ref 1.4–6.5)
Neutrophils Relative %: 67 %
Platelets: 139 10*3/uL — ABNORMAL LOW (ref 150–440)
RBC: 5.22 MIL/uL (ref 4.40–5.90)
RDW: 14.1 % (ref 11.5–14.5)
WBC: 4.2 10*3/uL (ref 3.8–10.6)

## 2016-04-06 NOTE — Progress Notes (Signed)
Chief complaint: Consultation requested for thrombocytopenia noted recently on a incidental blood draw.  History of present illness: [name] is known to have a low platelet count sine at least 2015 He has had no bleeding or bruising issues, no early satiety, no melena, hematochezia,no abdominal discomfort No heartburn, no weight loss, or change is appetite  He is very active and excellent [employer].   He is a retired Pharmacist, community. Had been exposed to needle sticks several times in the past  His brother has some sort of autoimmune disease.  He has a personal history of TIA which resulted in left-sided arm weakness transiently about 8-10 years ago.  He is on aspirin.  He is a diabetic well-controlled on oral hypoglycemics. He has a H/o Hypertension and Chronic kidney disease stage III.   Review of his records shows the following:  Colonoscopy 2 2017 a benign colonic polyp tubular adenoma and hyperplastic polyp negative for high-grade dysplasia or malignancy.  Platelet count was 141 noted in May 2015 at [facility].  Creatinine on 03/10/2016 was 1.4 previously 1.6-1.7 in that range since 2015. Mild transaminitis Nnted on 02/15/2016 with AST of 54 and ALT of 76 at [facility] Resolved to Normal on His Repeat Labs from 03/10/2016.   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 This appears to be fluctuating but  always remained over 100. He's not had any bleeding problems. He also had intermittent mild leukopenia but normal hemoglobin. The differential diagnosis includes  MDS, chronic ITP I'd  other like to r/o B12 deficiency, hepatitis C chronic infection,.  Given a history of TIA I would like to make sure he does not have lupus anticoagulant.  Also given his chronic kidney disease I think it makes sense ruling out monoclonal gammopathy.  I ordered a B12, SPEP IFE in the serum, hep C antibody, CBC in a citrated tube looking for any platelet clumping. Lupus  anticoagulant panel.  Return in 2 weeks to review results.

## 2016-04-07 LAB — LUPUS ANTICOAGULANT PANEL
DRVVT: 31.4 s (ref 0.0–47.0)
PTT LA: 32.4 s (ref 0.0–51.9)

## 2016-04-07 LAB — ANA W/REFLEX: ANA: NEGATIVE

## 2016-04-07 LAB — HEPATITIS C ANTIBODY: HCV Ab: <0.1 s/co ratio (ref 0.0–0.9)

## 2016-04-07 LAB — PATHOLOGIST SMEAR REVIEW

## 2016-04-08 LAB — MULTIPLE MYELOMA PANEL, SERUM
ALBUMIN SERPL ELPH-MCNC: 3.8 g/dL (ref 2.9–4.4)
ALBUMIN/GLOB SERPL: 1.5 (ref 0.7–1.7)
Alpha 1: 0.2 g/dL (ref 0.0–0.4)
Alpha2 Glob SerPl Elph-Mcnc: 0.6 g/dL (ref 0.4–1.0)
B-Globulin SerPl Elph-Mcnc: 1 g/dL (ref 0.7–1.3)
GAMMA GLOB SERPL ELPH-MCNC: 0.9 g/dL (ref 0.4–1.8)
GLOBULIN, TOTAL: 2.7 g/dL (ref 2.2–3.9)
IGA: 186 mg/dL (ref 61–437)
IgG (Immunoglobin G), Serum: 772 mg/dL (ref 700–1600)
IgM, Serum: 92 mg/dL (ref 15–143)
Total Protein ELP: 6.5 g/dL (ref 6.0–8.5)

## 2016-04-19 ENCOUNTER — Inpatient Hospital Stay (HOSPITAL_BASED_OUTPATIENT_CLINIC_OR_DEPARTMENT_OTHER): Payer: Medicare Other | Admitting: Internal Medicine

## 2016-04-19 VITALS — BP 152/95 | HR 59 | Temp 96.8°F | Wt 176.6 lb

## 2016-04-19 DIAGNOSIS — D72819 Decreased white blood cell count, unspecified: Secondary | ICD-10-CM

## 2016-04-19 DIAGNOSIS — N183 Chronic kidney disease, stage 3 (moderate): Secondary | ICD-10-CM

## 2016-04-19 DIAGNOSIS — D696 Thrombocytopenia, unspecified: Secondary | ICD-10-CM | POA: Diagnosis not present

## 2016-04-19 DIAGNOSIS — I129 Hypertensive chronic kidney disease with stage 1 through stage 4 chronic kidney disease, or unspecified chronic kidney disease: Secondary | ICD-10-CM | POA: Diagnosis not present

## 2016-04-19 DIAGNOSIS — Z8546 Personal history of malignant neoplasm of prostate: Secondary | ICD-10-CM

## 2016-04-19 DIAGNOSIS — Z79899 Other long term (current) drug therapy: Secondary | ICD-10-CM

## 2016-04-20 ENCOUNTER — Ambulatory Visit: Payer: Medicare Other

## 2016-04-20 ENCOUNTER — Inpatient Hospital Stay: Payer: Medicare Other

## 2016-04-20 DIAGNOSIS — Z8546 Personal history of malignant neoplasm of prostate: Secondary | ICD-10-CM | POA: Insufficient documentation

## 2016-04-20 DIAGNOSIS — D696 Thrombocytopenia, unspecified: Secondary | ICD-10-CM

## 2016-04-20 LAB — CBC
HEMATOCRIT: 49.1 % (ref 40.0–52.0)
Hemoglobin: 16.7 g/dL (ref 13.0–18.0)
MCH: 31.7 pg (ref 26.0–34.0)
MCHC: 33.9 g/dL (ref 32.0–36.0)
MCV: 93.6 fL (ref 80.0–100.0)
PLATELETS: 141 10*3/uL — AB (ref 150–440)
RBC: 5.25 MIL/uL (ref 4.40–5.90)
RDW: 13.9 % (ref 11.5–14.5)
WBC: 5.4 10*3/uL (ref 3.8–10.6)

## 2016-04-20 NOTE — Progress Notes (Signed)
Mr. Console returns today to review the blood work ordered for thrombocytopenia. He has mild thrombocytopenia dating back to at least 2013 with no significant change over the years. Today he denies any bleeding no bruising. Review of his blood work shows the following: Peripheral blood smear review by pathologist did not show any platelet forms or abnormal cells. His serum B12 level is low normal. His ANAs negative lupus anticoagulant is negative. Hepatitis C antibody is negative.  There was no monoclonal gammopathy detected   His past medical history includes adenomatous colonic polyps follows with GI most recent polyp removal was in 2017 February which was benign. He continues surveillance to GI. 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 Patient reports no bleeding problems and no other cytopenias noted. The differential diagnosis at this point includes myelodysplastic syndrome and ITP. Regardless of the cause he does not require any intervention at this time given the platelet count stability and absence of any bleeding.  I offered a bone marrow biopsy for further evaluation. Regardless of the results of the biopsy, current management will still remain observation. He opted to defer a biopsy until there are signs of progression either in the form of worseinng thrombocytopenia or development of new cytopenias.  I recommended that his CBC be followed at least every 3-4 months. He opted to have that followed by his PCP, Dr. Netty Starring whom he sees on a regular basis. I have instructed to him that he should contact us for a decline in counts or unexplained bleeding, petechiae or bruising.  A folllow-up is arranged here in 1 year.   Addendum: I will request an MMA given borderline B12.

## 2016-04-25 LAB — METHYLMALONIC ACID, SERUM: Methylmalonic Acid, Quantitative: 428 nmol/L — ABNORMAL HIGH (ref 0–378)

## 2016-04-26 ENCOUNTER — Other Ambulatory Visit: Payer: Self-pay | Admitting: Internal Medicine

## 2016-04-26 MED ORDER — CYANOCOBALAMIN 1000 MCG/ML IJ KIT: PACK | INTRAMUSCULAR | 2 refills | Status: DC

## 2017-04-18 ENCOUNTER — Other Ambulatory Visit: Payer: Self-pay | Admitting: Hematology and Oncology

## 2017-04-18 ENCOUNTER — Inpatient Hospital Stay (HOSPITAL_BASED_OUTPATIENT_CLINIC_OR_DEPARTMENT_OTHER): Payer: Medicare Other | Admitting: Hematology and Oncology

## 2017-04-18 ENCOUNTER — Other Ambulatory Visit: Payer: Self-pay | Admitting: *Deleted

## 2017-04-18 ENCOUNTER — Inpatient Hospital Stay: Payer: Medicare Other | Attending: Hematology and Oncology

## 2017-04-18 VITALS — BP 133/89 | HR 66 | Temp 98.2°F | Resp 18 | Wt 177.1 lb

## 2017-04-18 DIAGNOSIS — I129 Hypertensive chronic kidney disease with stage 1 through stage 4 chronic kidney disease, or unspecified chronic kidney disease: Secondary | ICD-10-CM | POA: Insufficient documentation

## 2017-04-18 DIAGNOSIS — Z85828 Personal history of other malignant neoplasm of skin: Secondary | ICD-10-CM

## 2017-04-18 DIAGNOSIS — E1122 Type 2 diabetes mellitus with diabetic chronic kidney disease: Secondary | ICD-10-CM | POA: Diagnosis not present

## 2017-04-18 DIAGNOSIS — Z7982 Long term (current) use of aspirin: Secondary | ICD-10-CM | POA: Insufficient documentation

## 2017-04-18 DIAGNOSIS — E538 Deficiency of other specified B group vitamins: Secondary | ICD-10-CM | POA: Diagnosis not present

## 2017-04-18 DIAGNOSIS — Z86010 Personal history of colonic polyps: Secondary | ICD-10-CM | POA: Diagnosis not present

## 2017-04-18 DIAGNOSIS — Z7984 Long term (current) use of oral hypoglycemic drugs: Secondary | ICD-10-CM | POA: Diagnosis not present

## 2017-04-18 DIAGNOSIS — Z79899 Other long term (current) drug therapy: Secondary | ICD-10-CM | POA: Diagnosis not present

## 2017-04-18 DIAGNOSIS — M1990 Unspecified osteoarthritis, unspecified site: Secondary | ICD-10-CM | POA: Diagnosis not present

## 2017-04-18 DIAGNOSIS — Z8673 Personal history of transient ischemic attack (TIA), and cerebral infarction without residual deficits: Secondary | ICD-10-CM | POA: Diagnosis not present

## 2017-04-18 DIAGNOSIS — Z8546 Personal history of malignant neoplasm of prostate: Secondary | ICD-10-CM | POA: Diagnosis not present

## 2017-04-18 DIAGNOSIS — D696 Thrombocytopenia, unspecified: Secondary | ICD-10-CM | POA: Diagnosis present

## 2017-04-18 DIAGNOSIS — D72819 Decreased white blood cell count, unspecified: Secondary | ICD-10-CM

## 2017-04-18 DIAGNOSIS — E7800 Pure hypercholesterolemia, unspecified: Secondary | ICD-10-CM | POA: Insufficient documentation

## 2017-04-18 DIAGNOSIS — N189 Chronic kidney disease, unspecified: Secondary | ICD-10-CM | POA: Insufficient documentation

## 2017-04-18 LAB — CBC WITH DIFFERENTIAL/PLATELET
Basophils Absolute: 0 10*3/uL (ref 0–0.1)
Basophils Relative: 1 %
Eosinophils Absolute: 0.1 10*3/uL (ref 0–0.7)
Eosinophils Relative: 4 %
HCT: 48.4 % (ref 40.0–52.0)
Hemoglobin: 16.4 g/dL (ref 13.0–18.0)
Lymphocytes Relative: 18 %
Lymphs Abs: 0.6 10*3/uL — ABNORMAL LOW (ref 1.0–3.6)
MCH: 32.2 pg (ref 26.0–34.0)
MCHC: 33.8 g/dL (ref 32.0–36.0)
MCV: 95 fL (ref 80.0–100.0)
Monocytes Absolute: 0.3 10*3/uL (ref 0.2–1.0)
Monocytes Relative: 9 %
Neutro Abs: 2.3 10*3/uL (ref 1.4–6.5)
Neutrophils Relative %: 68 %
Platelets: 143 10*3/uL — ABNORMAL LOW (ref 150–440)
RBC: 5.09 MIL/uL (ref 4.40–5.90)
RDW: 13.7 % (ref 11.5–14.5)
WBC: 3.3 10*3/uL — ABNORMAL LOW (ref 3.8–10.6)

## 2017-04-18 LAB — COMPREHENSIVE METABOLIC PANEL
ALT: 29 U/L (ref 17–63)
AST: 37 U/L (ref 15–41)
Albumin: 4.2 g/dL (ref 3.5–5.0)
Alkaline Phosphatase: 67 U/L (ref 38–126)
Anion gap: 8 (ref 5–15)
BUN: 29 mg/dL — ABNORMAL HIGH (ref 6–20)
CO2: 28 mmol/L (ref 22–32)
Calcium: 9 mg/dL (ref 8.9–10.3)
Chloride: 101 mmol/L (ref 101–111)
Creatinine, Ser: 1.81 mg/dL — ABNORMAL HIGH (ref 0.61–1.24)
GFR calc Af Amer: 40 mL/min — ABNORMAL LOW (ref >60)
GFR calc non Af Amer: 34 mL/min — ABNORMAL LOW (ref >60)
Glucose, Bld: 141 mg/dL — ABNORMAL HIGH (ref 65–99)
Potassium: 4 mmol/L (ref 3.5–5.1)
Sodium: 137 mmol/L (ref 135–145)
Total Bilirubin: 0.8 mg/dL (ref 0.3–1.2)
Total Protein: 6.7 g/dL (ref 6.5–8.1)

## 2017-04-18 LAB — VITAMIN B12: Vitamin B-12: 473 pg/mL (ref 180–914)

## 2017-04-18 LAB — FOLATE: Folate: 16.7 ng/mL (ref >5.9)

## 2017-04-18 LAB — TSH: TSH: 4.449 u[IU]/mL (ref 0.350–4.500)

## 2017-04-18 NOTE — Progress Notes (Signed)
[facility] day:  04/18/2017  Chief Complaint: James Watson is a 76 y.o. male with chronic thrombocytopenia who is seen for reassessment.  HPI:   He has a history of mild thrombocytopenia dating back to at least 2013/2015 with no significant change over the years.  Peripheral smear on 04/06/2016 revealed mild thrombocytopenia without clumping, unremarkable RBCs and WBCs, and no abnormal or immature cells.  B12 level was 248 (low normal) on 04/06/2016.  ANA, lupus anticoagulant, SPEP, hepatitis C antibody were negative.  MMA was 428 (high) on 04/20/2016 and c/w B12 deficiency.  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  Because of platelet count stability, he deferred bone marrow biopsy.  He states that he took B12 IM for 9 months.  He went out of town and the injections stopped.  Platelet count has been followed:  126,000 on 09/11/2012, 139,000 on 04/06/2016, 141,000 on 04/20/2016, and 154,000 on 08/18/2016.  He was last seen in the hematology clinic on 04/19/2016 by Dr. Creola Corn.  He has a history of adenomatous colonic polyps followed by GI most recent polyp removal in 07/2015.  He also has a history of prostate cancer treated with prostatectomy.  He notes chronic kidney disease.  He has been followed by Dr. Holley Raring.  Patient is taking Tumeric and CBD oil for chronic arthritic related pain. Patient states, "I cant take NSAIDs. I play golf and my hands hurt. I am experimenting with some things".   He denies any new medications or herbal products.  Patient denies bruising or bleeding; no hematochezia, melena, or gross hematuria. Patient last had a colonoscopy with Dr. Vira Agar in 2017.    Past Medical History:  Diagnosis Date  . Adenomatous colon polyp 06/18/2015  . Basal cell carcinoma   . BCC (basal cell carcinoma)    followed by Dr. Evorn Gong   . Chronic renal insufficiency   . Diabetes mellitus without  complication ([place])   . Hypercholesteremia   . Hypertension   . Osteoarthritis   . Prostate cancer ([place])   . Squamous cell carcinoma   . TIA (transient ischemic attack)     Past Surgical History:  Procedure Laterality Date  . left shoulder arthroscopy    . mtp repair    . PROSTATECTOMY    . tm repair      No family history on file.  Social History:  reports that  has never smoked. he has never used smokeless tobacco. He reports that he drinks about 1.2 oz of alcohol per week. His drug history is not on file.  He is a retired Pharmacist, community.  He lives in [place].  He likes to golf.  He travels frequently.  The patient is alone today.  Allergies:  Allergies  Allergen Reactions  . Enalapril Maleate Other (See Comments)    Difficulty breathing  . Hydrocodone Other (See Comments)  . Hydrocodone-Acetaminophen Nausea Only    Current Medications: Current Outpatient Medications  Medication Sig Dispense Refill  . amLODipine (NORVASC) 10 MG tablet TAKE 1 TABLET (5 MG TOTAL) BY MOUTH ONCE DAILY.    [name] aspirin EC 81 MG tablet Take by mouth.    [name] atorvastatin (LIPITOR) 80 MG tablet TAKE 1/2 TABLET BY MOUTH EVERY NIGHT  1  . Blood Glucose Monitoring Suppl (ONE TOUCH ULTRA 2) W/DEVICE KIT USE AS DIRECTED. DX: E11.22, N18.2  0  . celecoxib (CELEBREX) 100 MG capsule Take by mouth daily as needed. Reported on  07/15/2015    . Cholecalciferol (VITAMIN D-1000 MAX ST) 1000 UNITS tablet Take by mouth.    [name] glimepiride (AMARYL) 1 MG tablet TAKE 1 TABLET (1 MG TOTAL) BY MOUTH DAILY WITH BREAKFAST.  3  . losartan (COZAAR) 100 MG tablet TAKE 1 TABLET (100 MG TOTAL) BY MOUTH ONCE DAILY.  1  . ONE TOUCH ULTRA TEST test strip USE AS DIRECTED. CHECK CBG'S FASTING ONCE DAILY. DX: E11.22, N18.2  11  . ONETOUCH DELICA LANCETS FINE MISC Use 1 Units as directed. Check CBG's fasting once daily. Dx: E11.22, N18.2    . Cyanocobalamin (B-12 COMPLIANCE INJECTION) 1000 MCG/ML KIT 1 mg [place] once daily for 7 days,then once a  week x4 weeks, then once a month. (Patient not taking: Reported on 04/18/2017) 20 kit 2   No current facility-administered medications for this visit.     Review of Systems:  GENERAL:  Feels good.  Active.  No fevers, sweats or weight loss. PERFORMANCE STATUS (ECOG): 0 HEENT:  No visual changes, runny nose, sore throat, mouth sores or tenderness. Lungs: No shortness of breath or cough.  No hemoptysis. Cardiac:  No chest pain, palpitations, orthopnea, or PND. GI:  No nausea, vomiting, diarrhea, constipation, melena or hematochezia.  Colonoscopy 07/2015. GU:  No urgency, frequency, dysuria, or hematuria. Musculoskeletal:  No back pain.  No joint pain.  No muscle tenderness. Extremities:  No pain or swelling. Skin:  No rashes or skin changes. Neuro:  No headache, numbness or weakness, balance or coordination issues. Endocrine:  Diabetes.  No thyroid issues, hot flashes or night sweats. Psych:  No mood changes, depression or anxiety. Pain:  No focal pain. Review of systems:  All other systems reviewed and found to be negative.  Physical Exam: Blood pressure 133/89, pulse 66, temperature 98.2 F (36.8 C), temperature source Tympanic, resp. rate 18, weight 177 lb 2 oz (80.3 kg). GENERAL:  Well developed, well nourished, gentleman sitting comfortably in the exam room in no acute distress. MENTAL STATUS:  Alert and oriented to person, place and time. HEAD:  Pearline Cables hair.  Normocephalic, atraumatic, face symmetric, no Cushingoid features. EYES:  Glasses. Blue eyes.  Pupils equal round and reactive to light and accomodation.  No conjunctivitis or scleral icterus. ENT:  Oropharynx clear without lesion.  Tongue normal. Mucous membranes moist.  RESPIRATORY:  Clear to auscultation without rales, wheezes or rhonchi. CARDIOVASCULAR:  Regular rate and rhythm without murmur, rub or gallop. ABDOMEN:  Soft, non-tender, with active bowel sounds, and no hepatosplenomegaly.  No masses. SKIN:  No rashes,  ulcers or lesions. EXTREMITIES: No edema, no skin discoloration or tenderness.  No palpable cords. LYMPH NODES: No palpable cervical, supraclavicular, axillary or inguinal adenopathy  NEUROLOGICAL: Unremarkable. PSYCH:  Appropriate.   Appointment on 04/18/2017  Component Date Value Ref Range Status  . WBC 04/18/2017 3.3* 3.8 - 10.6 K/uL Final  . RBC 04/18/2017 5.09  4.40 - 5.90 MIL/uL Final  . Hemoglobin 04/18/2017 16.4  13.0 - 18.0 g/dL Final  . HCT 04/18/2017 48.4  40.0 - 52.0 % Final  . MCV 04/18/2017 95.0  80.0 - 100.0 fL Final  . MCH 04/18/2017 32.2  26.0 - 34.0 pg Final  . MCHC 04/18/2017 33.8  32.0 - 36.0 g/dL Final  . RDW 04/18/2017 13.7  11.5 - 14.5 % Final  . Platelets 04/18/2017 143* 150 - 440 K/uL Final  . Neutrophils Relative % 04/18/2017 68  % Final  . Neutro Abs 04/18/2017 2.3  1.4 - 6.5 K/uL Final  .  Lymphocytes Relative 04/18/2017 18  % Final  . Lymphs Abs 04/18/2017 0.6* 1.0 - 3.6 K/uL Final  . Monocytes Relative 04/18/2017 9  % Final  . Monocytes Absolute 04/18/2017 0.3  0.2 - 1.0 K/uL Final  . Eosinophils Relative 04/18/2017 4  % Final  . Eosinophils Absolute 04/18/2017 0.1  0 - 0.7 K/uL Final  . Basophils Relative 04/18/2017 1  % Final  . Basophils Absolute 04/18/2017 0.0  0 - 0.1 K/uL Final  . Sodium 04/18/2017 137  135 - 145 mmol/L Final  . Potassium 04/18/2017 4.0  3.5 - 5.1 mmol/L Final  . Chloride 04/18/2017 101  101 - 111 mmol/L Final  . CO2 04/18/2017 28  22 - 32 mmol/L Final  . Glucose, Bld 04/18/2017 141* 65 - 99 mg/dL Final  . BUN 04/18/2017 29* 6 - 20 mg/dL Final  . Creatinine, Ser 04/18/2017 1.81* 0.61 - 1.24 mg/dL Final  . Calcium 04/18/2017 9.0  8.9 - 10.3 mg/dL Final  . Total Protein 04/18/2017 6.7  6.5 - 8.1 g/dL Final  . Albumin 04/18/2017 4.2  3.5 - 5.0 g/dL Final  . AST 04/18/2017 37  15 - 41 U/L Final  . ALT 04/18/2017 29  17 - 63 U/L Final  . Alkaline Phosphatase 04/18/2017 67  38 - 126 U/L Final  . Total Bilirubin 04/18/2017 0.8  0.3  - 1.2 mg/dL Final  . GFR calc non Af Amer 04/18/2017 34* >60 mL/min Final  . GFR calc Af Amer 04/18/2017 40* >60 mL/min Final   Comment: (NOTE) The eGFR has been calculated using the CKD EPI equation. This calculation has not been validated in all clinical situations. eGFR's persistently <60 mL/min signify possible Chronic Kidney Disease.   . Anion gap 04/18/2017 8  5 - 15 Final    Assessment:  James Watson is a 76 y.o. male with mild thrombocytopenia dating back to at least 2013/2015 with no significant change over the years.  Peripheral smear on 04/06/2016 revealed mild thrombocytopenia with no abnormal or immature cells.  ANA, lupus anticoagulant, SPEP, hepatitis C antibody were negative.  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  Because of platelet count stability, he deferred bone marrow biopsy.  Platelet count has been followed:  126,000 on 09/11/2012, 139,000 on 04/06/2016, 141,000 on 04/20/2016, 154,000 on 08/18/2016, and 143,000 on 04/18/2017.  He has a history of B12 deficiency.  B12 level was 248 (low normal) on 04/06/2016.  MMA was 428 (high) on 04/20/2016.  He took B12 IM for 9 months.  He went out of town and the injections stopped.  He has a history of adenomatous colonic polyps.  Last colonoscopy was in 07/2015.  He has a history of prostate cancer treated with prostatectomy.  He has chronic kidney disease.    He denies any new medications or herbal products.  Patient denies any bruising or bleeding; no hematochezia, melena, or gross hematuria.  Platelet count is 143,000.  Plan: 1.  Labs today:  CBC with diff, platelet count in blue top tube, CMP, B12, folate, MMA, TSH, hepatitis B core antibody, H pylori antibody, 24 hour urine with IFE, FLCA. 2.  Discuss further workup of mild thrombocytopenia.  3.  Discuss B12 level. Will recheck level today. Patient to try oral supplementation in the interim. Discuss potential need for IM injections.  4.  RTC  in 2 weeks for labs (CBC with diff, anti-parietal Ab, intrinsic factor Ab). 5.  RTC in 1 year for MD assessment  and labs (CBC with diff, folate).   Honor Loh, NP  04/18/2017, 10:46 AM   I saw and evaluated the patient, participating in the key portions of the service and reviewing pertinent diagnostic studies and records.  I reviewed the nurse practitioner's note and agree with the findings and the plan.  The assessment and plan were discussed with the patient.  Several questions were asked by the patient and answered.   Nolon Stalls, MD 04/18/2017, 10:46 AM

## 2017-04-18 NOTE — Progress Notes (Signed)
Pt in today for yearly follow up on thrombocytopenia. Denies any difficulties or concerns at this time.

## 2017-04-19 DIAGNOSIS — D696 Thrombocytopenia, unspecified: Secondary | ICD-10-CM | POA: Diagnosis not present

## 2017-04-19 LAB — HEPATITIS B CORE ANTIBODY, TOTAL: Hep B Core Total Ab: NEGATIVE

## 2017-04-19 LAB — H. PYLORI ANTIBODY, IGG: H Pylori IgG: <0.8 Index Value (ref 0.00–0.79)

## 2017-04-20 ENCOUNTER — Other Ambulatory Visit: Payer: Self-pay

## 2017-04-20 DIAGNOSIS — D696 Thrombocytopenia, unspecified: Secondary | ICD-10-CM

## 2017-04-20 LAB — METHYLMALONIC ACID, SERUM: Methylmalonic Acid, Quantitative: 462 nmol/L — ABNORMAL HIGH (ref 0–378)

## 2017-04-23 ENCOUNTER — Encounter: Payer: Self-pay | Admitting: Hematology and Oncology

## 2017-04-24 LAB — UIFE/LIGHT CHAINS/TP QN, 24-HR UR
% BETA, Urine: 7.6 %
ALPHA 1 URINE: 0.5 %
Albumin, U: 84.3 %
Alpha 2, Urine: 1.2 %
FREE KAPPA/LAMBDA RATIO: 20.39 — AB (ref 2.04–10.37)
FREE LAMBDA LT CHAINS, UR: 0.76 mg/L (ref 0.24–6.66)
FREE LT CHN EXCR RATE: 15.5 mg/L (ref 1.35–24.19)
GAMMA GLOBULIN URINE: 6.5 %
TOTAL VOLUME: 3000
Total Protein, Urine-Ur/day: 618 mg/24 hr — ABNORMAL HIGH (ref 30–150)
Total Protein, Urine: 20.6 mg/dL

## 2017-05-02 ENCOUNTER — Inpatient Hospital Stay: Payer: Medicare Other

## 2017-05-03 ENCOUNTER — Inpatient Hospital Stay: Payer: Medicare Other

## 2017-05-03 DIAGNOSIS — D72819 Decreased white blood cell count, unspecified: Secondary | ICD-10-CM

## 2017-05-03 DIAGNOSIS — D696 Thrombocytopenia, unspecified: Secondary | ICD-10-CM

## 2017-05-03 LAB — CBC WITH DIFFERENTIAL/PLATELET
Basophils Absolute: 0 10*3/uL (ref 0–0.1)
Basophils Relative: 1 %
Eosinophils Absolute: 0.1 10*3/uL (ref 0–0.7)
Eosinophils Relative: 3 %
HCT: 49.2 % (ref 40.0–52.0)
Hemoglobin: 16.6 g/dL (ref 13.0–18.0)
Lymphocytes Relative: 21 %
Lymphs Abs: 0.8 10*3/uL — ABNORMAL LOW (ref 1.0–3.6)
MCH: 32.2 pg (ref 26.0–34.0)
MCHC: 33.7 g/dL (ref 32.0–36.0)
MCV: 95.5 fL (ref 80.0–100.0)
Monocytes Absolute: 0.3 10*3/uL (ref 0.2–1.0)
Monocytes Relative: 9 %
Neutro Abs: 2.6 10*3/uL (ref 1.4–6.5)
Neutrophils Relative %: 66 %
Platelets: 135 10*3/uL — ABNORMAL LOW (ref 150–440)
RBC: 5.15 MIL/uL (ref 4.40–5.90)
RDW: 13.5 % (ref 11.5–14.5)
WBC: 4 10*3/uL (ref 3.8–10.6)

## 2017-05-04 LAB — INTRINSIC FACTOR ANTIBODIES: Intrinsic Factor: 1.1 AU/mL (ref 0.0–1.1)

## 2017-05-04 LAB — ANTI-PARIETAL ANTIBODY: Parietal Cell Antibody-IgG: 1.8 Units (ref 0.0–20.0)

## 2017-05-05 LAB — KAPPA/LAMBDA LIGHT CHAINS
Kappa free light chain: 26.5 mg/L — ABNORMAL HIGH (ref 3.3–19.4)
Kappa, lambda light chain ratio: 2.48 — ABNORMAL HIGH (ref 0.26–1.65)
Lambda free light chains: 10.7 mg/L (ref 5.7–26.3)

## 2017-05-05 LAB — COPPER, SERUM: Copper: 94 ug/dL (ref 72–166)

## 2017-06-13 ENCOUNTER — Telehealth: Payer: Self-pay | Admitting: *Deleted

## 2017-06-13 NOTE — Telephone Encounter (Signed)
His B12, antiparietal Ab, and intrinsic factor labs are all normal. He does not need B12 at this time.

## 2017-06-13 NOTE — Telephone Encounter (Signed)
Patient asking if he is going to be needing B12 injection per lab results. He has not heard back on his results since last visit. States he does need to go over results, just needs to know if he will be needing IM B 12. Please advise

## 2017-06-13 NOTE — Telephone Encounter (Signed)
Patient informed of NP response

## 2018-04-02 ENCOUNTER — Other Ambulatory Visit: Payer: Self-pay | Admitting: Rheumatology

## 2018-04-02 DIAGNOSIS — M25552 Pain in left hip: Secondary | ICD-10-CM

## 2018-04-13 ENCOUNTER — Other Ambulatory Visit: Payer: Self-pay | Admitting: *Deleted

## 2018-04-13 DIAGNOSIS — Z8546 Personal history of malignant neoplasm of prostate: Secondary | ICD-10-CM

## 2018-04-17 ENCOUNTER — Other Ambulatory Visit: Payer: Self-pay | Admitting: Hematology and Oncology

## 2018-04-17 ENCOUNTER — Inpatient Hospital Stay: Payer: Medicare Other | Attending: Hematology and Oncology

## 2018-04-17 ENCOUNTER — Inpatient Hospital Stay (HOSPITAL_BASED_OUTPATIENT_CLINIC_OR_DEPARTMENT_OTHER): Payer: Medicare Other | Admitting: Hematology and Oncology

## 2018-04-17 ENCOUNTER — Other Ambulatory Visit: Payer: Self-pay

## 2018-04-17 ENCOUNTER — Encounter: Payer: Self-pay | Admitting: Hematology and Oncology

## 2018-04-17 VITALS — BP 147/93 | HR 56 | Temp 96.5°F | Resp 18 | Wt 177.5 lb

## 2018-04-17 DIAGNOSIS — N189 Chronic kidney disease, unspecified: Secondary | ICD-10-CM | POA: Insufficient documentation

## 2018-04-17 DIAGNOSIS — R7989 Other specified abnormal findings of blood chemistry: Secondary | ICD-10-CM

## 2018-04-17 DIAGNOSIS — E538 Deficiency of other specified B group vitamins: Secondary | ICD-10-CM

## 2018-04-17 DIAGNOSIS — D696 Thrombocytopenia, unspecified: Secondary | ICD-10-CM | POA: Insufficient documentation

## 2018-04-17 DIAGNOSIS — E1122 Type 2 diabetes mellitus with diabetic chronic kidney disease: Secondary | ICD-10-CM | POA: Insufficient documentation

## 2018-04-17 DIAGNOSIS — C61 Malignant neoplasm of prostate: Secondary | ICD-10-CM | POA: Diagnosis not present

## 2018-04-17 DIAGNOSIS — I129 Hypertensive chronic kidney disease with stage 1 through stage 4 chronic kidney disease, or unspecified chronic kidney disease: Secondary | ICD-10-CM

## 2018-04-17 DIAGNOSIS — Z8673 Personal history of transient ischemic attack (TIA), and cerebral infarction without residual deficits: Secondary | ICD-10-CM

## 2018-04-17 DIAGNOSIS — D751 Secondary polycythemia: Secondary | ICD-10-CM | POA: Diagnosis not present

## 2018-04-17 DIAGNOSIS — Z8546 Personal history of malignant neoplasm of prostate: Secondary | ICD-10-CM | POA: Insufficient documentation

## 2018-04-17 DIAGNOSIS — Z85828 Personal history of other malignant neoplasm of skin: Secondary | ICD-10-CM | POA: Diagnosis not present

## 2018-04-17 LAB — COMPREHENSIVE METABOLIC PANEL
ALT: 36 U/L (ref 0–44)
AST: 36 U/L (ref 15–41)
Albumin: 4.5 g/dL (ref 3.5–5.0)
Alkaline Phosphatase: 65 U/L (ref 38–126)
Anion gap: 7 (ref 5–15)
BUN: 26 mg/dL — ABNORMAL HIGH (ref 8–23)
CO2: 27 mmol/L (ref 22–32)
Calcium: 9.3 mg/dL (ref 8.9–10.3)
Chloride: 101 mmol/L (ref 98–111)
Creatinine, Ser: 1.72 mg/dL — ABNORMAL HIGH (ref 0.61–1.24)
GFR calc Af Amer: 42 mL/min — ABNORMAL LOW (ref >60)
GFR calc non Af Amer: 36 mL/min — ABNORMAL LOW (ref >60)
Glucose, Bld: 127 mg/dL — ABNORMAL HIGH (ref 70–99)
Potassium: 3.8 mmol/L (ref 3.5–5.1)
Sodium: 135 mmol/L (ref 135–145)
Total Bilirubin: 0.7 mg/dL (ref 0.3–1.2)
Total Protein: 7 g/dL (ref 6.5–8.1)

## 2018-04-17 LAB — CBC WITH DIFFERENTIAL/PLATELET
Abs Immature Granulocytes: 0.01 10*3/uL (ref 0.00–0.07)
Basophils Absolute: 0.1 10*3/uL (ref 0.0–0.1)
Basophils Relative: 1 %
Eosinophils Absolute: 0.1 10*3/uL (ref 0.0–0.5)
Eosinophils Relative: 3 %
HCT: 51.1 % (ref 39.0–52.0)
Hemoglobin: 17.1 g/dL — ABNORMAL HIGH (ref 13.0–17.0)
Immature Granulocytes: 0 %
Lymphocytes Relative: 19 %
Lymphs Abs: 0.8 10*3/uL (ref 0.7–4.0)
MCH: 31.6 pg (ref 26.0–34.0)
MCHC: 33.5 g/dL (ref 30.0–36.0)
MCV: 94.5 fL (ref 80.0–100.0)
Monocytes Absolute: 0.4 10*3/uL (ref 0.1–1.0)
Monocytes Relative: 10 %
Neutro Abs: 2.9 10*3/uL (ref 1.7–7.7)
Neutrophils Relative %: 67 %
Platelets: 153 10*3/uL (ref 150–400)
RBC: 5.41 MIL/uL (ref 4.22–5.81)
RDW: 12.4 % (ref 11.5–15.5)
WBC: 4.3 10*3/uL (ref 4.0–10.5)
nRBC: 0 % (ref 0.0–0.2)

## 2018-04-17 LAB — FOLATE: Folate: 11.6 ng/mL (ref >5.9)

## 2018-04-17 LAB — VITAMIN B12: Vitamin B-12: 227 pg/mL (ref 180–914)

## 2018-04-17 NOTE — Progress Notes (Signed)
[facility]-  Cancer Center  Clinic day:  04/17/2018  Chief Complaint: James Watson is a 77 y.o. 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  HPI:   The patient was last seen in the hematology clinic on 04/18/2017 for new patient assessment.  He had mild thrombocytopenia dating back to at least 2013/2015 with no significant change over the years.  Platelet count had ranged between 123,000 - 146,000.    He had a history of B12 deficiency.  He was briefly on B12, but had stopped.  He underwent a work-up.  CBC revealed a hematocrit of 48.4, hemoglobin 16.4, MCV 95, platelets 143,000, WBC 3300 with an ANC of 2300.  ALC was 600.  CMP revealed a creatinine 1.81.  Calcium, albumen, and protein were normal.  LFTs were normal.  Folate was 16.7.  B12 was 473.  MMA was 462 (high).  TSH was 4.449.  H pylori IgG antibody was negative.  Hepatitis B core antibody total.    Copper level was 94 on 05/03/2017.  Anti-parietal antibody and intrinsic factor antibody were negative.  Myeloma panel on 04/06/2016 revealed no monoclonal protein and normal immunoglobulins. IFE urine on 04/19/2017 revealed 618 mg protein in 24 hours (30-150).  Free light chain excretion rate was 15.5 mg/L (normal), lambda free light chains 0.76 mg/L (normal) and free kappa/lambda ration 20.39 (2.04-10.37).  During the interim, he has felt pretty good.  He has chronic kidney disease (CrCl 28 ml/min 2 weeks ago).  He sees Dr. Lateef.  He states that he drinks a lot of water.    He denies any smoking or second hand smoke.  He denies any sleep apnea.  He does not snore.  He is not on testosterone.     Past Medical History:  Diagnosis Date  . Adenomatous colon polyp 06/18/2015  . Basal cell carcinoma   . BCC (basal cell carcinoma)    followed by Dr. Dasher   . Chronic renal insufficiency   . Diabetes mellitus without complication (HCC)   . Hypercholesteremia   . Hypertension   .  Osteoarthritis   . Prostate cancer (HCC)   . Squamous cell carcinoma   . TIA (transient ischemic attack)     Past Surgical History:  Procedure Laterality Date  . COLONOSCOPY WITH PROPOFOL N/A 07/16/2015   Procedure: COLONOSCOPY WITH PROPOFOL;  Surgeon: Robert T Elliott, MD;  Location: [facility] ENDOSCOPY;  Service: Endoscopy;  Laterality: N/A;  . left shoulder arthroscopy    . mtp repair    . PROSTATECTOMY    . tm repair      History reviewed. No pertinent family history.  Social History:  reports that he has never smoked. He has never used smokeless tobacco. He reports that he drinks about 2.0 standard drinks of alcohol per week. His drug history is not on file.  He is a retired dentist.  He lives in [place].  He likes to golf.  He travels frequently.  The patient is alone today.  Allergies:  Allergies  Allergen Reactions  . Enalapril Maleate Other (See Comments)    Difficulty breathing  . Hydrocodone Other (See Comments)  . Hydrocodone-Acetaminophen Nausea Only    Current Medications: Current Outpatient Medications  Medication Sig Dispense Refill  . amLODipine (NORVASC) 10 MG tablet TAKE 1 TABLET (5 MG TOTAL) BY MOUTH ONCE DAILY.    . aspirin EC 81 MG tablet Take by mouth.    . atorvastatin (LIPITOR) 80 MG   tablet TAKE 1/2 TABLET BY MOUTH EVERY NIGHT  1  . Blood Glucose Monitoring Suppl (ONE TOUCH ULTRA 2) W/DEVICE KIT USE AS DIRECTED. DX: E11.22, N18.2  0  . celecoxib (CELEBREX) 100 MG capsule Take by mouth daily as needed. Reported on 07/15/2015    . Cholecalciferol (VITAMIN D-1000 MAX ST) 1000 UNITS tablet Take by mouth.    . Cyanocobalamin (B-12 COMPLIANCE INJECTION) 1000 MCG/ML KIT 1 mg [place] once daily for 7 days,then once a week x4 weeks, then once a month. 20 kit 2  . glimepiride (AMARYL) 1 MG tablet TAKE 1 TABLET (1 MG TOTAL) BY MOUTH DAILY WITH BREAKFAST.  3  . losartan (COZAAR) 100 MG tablet TAKE 1 TABLET (100 MG TOTAL) BY MOUTH ONCE DAILY.  1  . ONE TOUCH ULTRA TEST test  strip USE AS DIRECTED. CHECK CBG'S FASTING ONCE DAILY. DX: E11.22, N18.2  11  . ONETOUCH DELICA LANCETS FINE MISC Use 1 Units as directed. Check CBG's fasting once daily. Dx: E11.22, N18.2     No current facility-administered medications for this visit.     Review of Systems:  GENERAL:  Feels "pretty good".  No fevers, sweats or weight loss. PERFORMANCE STATUS (ECOG):  0 HEENT:  No visual changes, runny nose, sore throat, mouth sores or tenderness. Lungs: No shortness of breath or cough.  No hemoptysis. Cardiac:  No chest pain, palpitations, orthopnea, or PND. GI:  No nausea, vomiting, diarrhea, constipation, melena or hematochezia. GU:  No urgency, frequency, dysuria, or hematuria. Drinking lots of water. Musculoskeletal:  No back pain.  No joint pain.  No muscle tenderness. Extremities:  No pain or swelling. Skin:  No rashes or skin changes. Neuro:  No headache, numbness or weakness, balance or coordination issues. Endocrine:  Diabetes.  No thyroid issues, hot flashes or night sweats. Psych:  No mood changes, depression or anxiety. Pain:  No focal pain. Review of systems:  All other systems reviewed and found to be negative.   Physical Exam: Blood pressure (!) 147/93, pulse (!) 56, temperature (!) 96.5 F (35.8 C), temperature source Tympanic, resp. rate 18, weight 177 lb 8 oz (80.5 kg). GENERAL:  Well developed, well nourished, gentleman sitting comfortably in the exam room in no acute distress. MENTAL STATUS:  Alert and oriented to person, place and time. HEAD:  Short gray hair.  Normocephalic, atraumatic, face symmetric, no Cushingoid features. EYES:  Glasses.  Blue eyes.  Pupils equal round and reactive to light and accomodation.  No conjunctivitis or scleral icterus. ENT:  Oropharynx clear without lesion.  Tongue normal. Mucous membranes moist.  RESPIRATORY:  Clear to auscultation without rales, wheezes or rhonchi. CARDIOVASCULAR:  Regular rate and rhythm without murmur, rub or  gallop. ABDOMEN:  Soft, non-tender, with active bowel sounds, and no hepatosplenomegaly.  No masses. SKIN:  No rashes, ulcers or lesions. EXTREMITIES: No edema, no skin discoloration or tenderness.  No palpable cords. LYMPH NODES: No palpable cervical, supraclavicular, axillary or inguinal adenopathy  NEUROLOGICAL: Unremarkable. PSYCH:  Appropriate.    Orders Only on 04/17/2018  Component Date Value Ref Range Status  . Sodium 04/17/2018 135  135 - 145 mmol/L Final  . Potassium 04/17/2018 3.8  3.5 - 5.1 mmol/L Final  . Chloride 04/17/2018 101  98 - 111 mmol/L Final  . CO2 04/17/2018 27  22 - 32 mmol/L Final  . Glucose, Bld 04/17/2018 127* 70 - 99 mg/dL Final  . BUN 04/17/2018 26* 8 - 23 mg/dL Final  . Creatinine, Ser 04/17/2018 1.72*   0.61 - 1.24 mg/dL Final  . Calcium 04/17/2018 9.3  8.9 - 10.3 mg/dL Final  . Total Protein 04/17/2018 7.0  6.5 - 8.1 g/dL Final  . Albumin 04/17/2018 4.5  3.5 - 5.0 g/dL Final  . AST 04/17/2018 36  15 - 41 U/L Final  . ALT 04/17/2018 36  0 - 44 U/L Final  . Alkaline Phosphatase 04/17/2018 65  38 - 126 U/L Final  . Total Bilirubin 04/17/2018 0.7  0.3 - 1.2 mg/dL Final  . GFR calc non Af Amer 04/17/2018 36* >60 mL/min Final  . GFR calc Af Amer 04/17/2018 42* >60 mL/min Final   Comment: (NOTE) The eGFR has been calculated using the CKD EPI equation. This calculation has not been validated in all clinical situations. eGFR's persistently <60 mL/min signify possible Chronic Kidney Disease.   . Anion gap 04/17/2018 7  5 - 15 Final   Performed at [facility], [address] [place], [place] [address]  . Kappa free light chain 04/17/2018 25.2* 3.3 - 19.4 mg/L Final  . Lamda free light chains 04/17/2018 11.0  5.7 - 26.3 mg/L Final  . Kappa, lamda light chain ratio 04/17/2018 2.29* 0.26 - 1.65 Final   Comment: (NOTE) Performed At: [facility] [place] [address] [place], [place] [id number] Nagendra Sanjai MD Ph:[phone]   . Total Protein ELP  04/17/2018 6.4  6.0 - 8.5 g/dL Final  . Albumin ELP 04/17/2018 4.1  2.9 - 4.4 g/dL Final  . Alpha-1-Globulin 04/17/2018 0.1  0.0 - 0.4 g/dL Final  . Alpha-2-Globulin 04/17/2018 0.8  0.4 - 1.0 g/dL Final  . Beta Globulin 04/17/2018 0.8  0.7 - 1.3 g/dL Final  . Gamma Globulin 04/17/2018 0.5  0.4 - 1.8 g/dL Final  . M-Spike, % 04/17/2018 Not Observed  Not Observed g/dL Final  . SPE Interp. 04/17/2018 Comment   Final   Comment: (NOTE) The SPE pattern appears essentially unremarkable. Evidence of monoclonal protein is not apparent. Performed At: [facility] [place] [address] [place], [place] [id number] Nagendra Sanjai MD Ph:[phone]   . Comment 04/17/2018 Comment   Final   Comment: (NOTE) Protein electrophoresis scan will follow via computer, mail, or courier delivery.   . GLOBULIN, TOTAL 04/17/2018 2.3  2.2 - 3.9 g/dL Corrected  . A/G Ratio 04/17/2018 1.8* 0.7 - 1.7 Corrected  . Vitamin B-12 04/17/2018 227  180 - 914 pg/mL Final   Comment: (NOTE) This assay is not validated for testing neonatal or myeloproliferative syndrome specimens for Vitamin B12 levels. Performed at [facility], [address] [place], [place] [address]   . Folate 04/17/2018 11.6  >5.9 ng/mL Final   Performed at [facility], [address] [place],  [address]  . WBC 04/17/2018 4.3  4.0 - 10.5 K/uL Final  . RBC 04/17/2018 5.41  4.22 - 5.81 MIL/uL Final  . Hemoglobin 04/17/2018 17.1* 13.0 - 17.0 g/dL Final  . HCT 04/17/2018 51.1  39.0 - 52.0 % Final  . MCV 04/17/2018 94.5  80.0 - 100.0 fL Final  . MCH 04/17/2018 31.6  26.0 - 34.0 pg Final  . MCHC 04/17/2018 33.5  30.0 - 36.0 g/dL Final  . RDW 04/17/2018 12.4  11.5 - 15.5 % Final  . Platelets 04/17/2018 153  150 - 400 K/uL Final  . nRBC 04/17/2018 0.0  0.0 - 0.2 % Final  . Neutrophils Relative % 04/17/2018 67  % Final  . Neutro Abs 04/17/2018 2.9  1.7 - 7.7 K/uL Final  .   Lymphocytes Relative 04/17/2018 19  % Final  . Lymphs  Abs 04/17/2018 0.8  0.7 - 4.0 K/uL Final  . Monocytes Relative 04/17/2018 10  % Final  . Monocytes Absolute 04/17/2018 0.4  0.1 - 1.0 K/uL Final  . Eosinophils Relative 04/17/2018 3  % Final  . Eosinophils Absolute 04/17/2018 0.1  0.0 - 0.5 K/uL Final  . Basophils Relative 04/17/2018 1  % Final  . Basophils Absolute 04/17/2018 0.1  0.0 - 0.1 K/uL Final  . Immature Granulocytes 04/17/2018 0  % Final  . Abs Immature Granulocytes 04/17/2018 0.01  0.00 - 0.07 K/uL Final   Performed at [facility], [address] [place], [place] [address]  . Testosterone 04/17/2018 257* 264 - 916 ng/dL Final   Comment: (NOTE) Adult male reference interval is based on a population of healthy nonobese males (BMI <30) between 73 and 68 years old. [place], [place] [phone]. PMID: 16010932. Performed At: [facility] [place], [place] [id number] Rush Farmer MD RK:[id number]   . Carbon Monoxide, Blood 04/17/2018 2.6  0.0 - 3.6 % Final   Comment: (NOTE)                            Environmental Exposure:                             Nonsmokers           <3.7                             Smokers              <9.9                            Occupational Exposure:                             BEI                   3.5                                Detection Limit =  0.2 Performed At: [facility] [place], [place] [id number] Rush Farmer MD VP:[id number]   . Erythropoietin 04/17/2018 12.8  2.6 - 18.5 mIU/mL Final   Comment: (NOTE) Beckman Coulter UniCel DxI [place] obtained with different assay methods or kits cannot be used interchangeably. Results cannot be interpreted as absolute evidence of the presence or absence of malignant disease. Performed At: [facility] [place], [place] [id number] Rush Farmer MD XF:[id number]   . JAK2 GenotypR 04/17/2018 Comment   Final   Comment:  (NOTE) Result: NEGATIVE for the JAK2 V617F mutation. Interpretation:  The G to T nucleotide change encoding the V617F mutation was not detected.  This result does not rule out the presence of the JAK2 mutation at a level below the sensitivity of detection of this assay, or the presence of other mutations within JAK2 not detected by this assay.  This result does not rule out a diagnosis of polycythemia vera, essential thrombocythemia or idiopathic myelofibrosis as the V617F mutation is not detected in all patients with  these disorders.   . BACKGROUND: 04/17/2018 Comment   Final   Comment: (NOTE) JAK2 is a cytoplasmic tyrosine kinase with a key role in signal transduction from multiple hematopoietic growth factor receptors. A point mutation within exon 14 of the JAK2 gene ([id number]) encoding a valine to phenylalanine substitution at position 617 of the JAK2 protein (V617F) has been identified in most patients with polycythemia vera, and in about half of those with either essential thrombocythemia or idiopathic myelofibrosis. The V617F has also been detected, although infrequently, in other myeloid disorders such as chronic myelomonocytic leukemia and chronic neutrophilic luekemia. V617F is an acquired mutation that alters a highly conserved valine present in the negative regulatory JH2 domain of the JAK2 protein and is predicted to dysregulate kinase activity. Methodology: Total genomic DNA was extracted and subjected to TaqMan real-time PCR amplification/detection. Two amplification products per sample were monitored by real-time PCR using primers/probes s                          pecific to JAK2 wild type (WT) and JAK2 mutant V617F. The ABI7900 Absolute Quantitation software will compare the patient specimen valuse to the standard curves and generate percent values for wild type and mutant type. In vitro studies have indicated that this assay has an analytical sensitivity of  1%. References: Baxter EJ, Scott LM, Campbell PJ, et al. Acquired mutation of the tyrosine kinase JAK2 in human myeloproliferative disorders. Lancet. 2005 Mar 19-25; 365(9464):1054-1061. James C, Ugo V, Le Couedic JP. A unique clonal JAK2 mutation leading to constitutive signaling causes polycythaemia vera. Nature. 2005 Apr 28; 434(7037):1144-1148. Kralovics R, Passamonti F, Buser AS, et al. A gain-of-function mutation of JAK2 in myeloproliferative disorders. N Engl J Med. 2005 Apr 28; 352(17):1779-1790.   . Director Review, JAK2 04/17/2018 Comment   Final   Comment: (NOTE) Li Cai, PhD, FACMG               Director, Molecular Genetics               [facility]               [place], [place]               [phone] This test was developed and its performance characteristics determined by [facility]. It has not been cleared or approved by the Food and Drug Administration.   . REFLEX: 04/17/2018 Comment   Final   Comment: (NOTE) Reflex to CALR Mutation Analysis, JAK2 Exon 12-15 Mutation Analysis, and MPL Mutation Analysis is indicated.   . Extraction 04/17/2018 Completed   Corrected   Comment: (NOTE) Performed At: [facility] [place] [address] [place], [place] [id number] Chatterjee Arundhati MD Ph:[phone] Performed At: [facility] [place] [address] [place], [place] [id number] Chatterjee Arundhati MD Ph:[phone]   . CALR Mutation Detection Result 04/17/2018 Comment   Final   Comment: (NOTE) NEGATIVE No insertions or deletions were detected within the analyzed region of the calreticulin (CALR) gene. A negative result does not entirely exclude the possibility of a clonal population carrying CALR gene mutations that are not covered by this assay. Results should be interpreted in conjunction with clinical and laboratory findings for the most accurate interpretation.   . Background: 04/17/2018 Comment    Final   Comment: (NOTE) The calcium-binding endoplasmic reticulin chaperone   protein, calreticulin (CALR), is somatically mutated in approximately 70% of patients with JAK2-negative essential thrombocythemia (ET) and 60- 88% of patients with JAK2-negative primary myelofibrosis(PMF). Only a minority of patients (approximately 8%) with myelodysplasia have mutations in  CALR gene. CALR mutations are rarely detected in patients with de novo acute myeloid leukemia, chronic myelogenous leukemia, lymphoid leukemia, or solid tumors. CALR mutations are not detected in polycythemia and generally appear to be mutually exclusive with JAK2 mutations and MPL mutations. The majority of mutational changes involve a variety of insertion or deletion mutations in exon 9 of the calreticulin gene: approximately 53% of all CALR mutations are a 52 bp deletion (type-1) while the second most prevalent mutation (approximately 32%) contains a 5 bp insertion (type-2). Other mutations (non-type 1 or type 2) are seen                           in a small minority of cases. CALR mutations in PMF tend to be associated with a favorable prognosis compared to JAK2 V617F mutations, whereas primary myelofibrosis negative for CALR, JAK2 V617F and MPL mutations (so-called triple negative) is associated with a poor prognosis and shorter survival. The detection of a CALR gene mutation aids in the specific diagnosis of a myeloproliferative neoplasm, and help distinguish this clonal disease from a benign reactive process.   . Methodology: 04/17/2018 Comment   Final   Comment: (NOTE) Genomic DNA was isolated from the provided specimen. Polymerase chain reaction (PCR) of exon 9 of the CALR gene was performed with specific fluorescent-labeled primers, and the PCR product was analyzed by capillary gel electrophoresis to determine the size of the PCR products. This PCR assay is capable of detecting a mutant cell population with a  sensitivity of 5 mutant cells per 100 normal cells. A negative result does not exclude the presence of a myeloproliferative disorder or other neoplastic process. This test was developed and its performance characteristics determined by [facility]. It has not been cleared or approved by the Food and Drug Administration. The FDA has determined that such clearance or approval is not necessary.   . References: 04/17/2018 Comment   Final   Comment: (NOTE) 1. Klampfel, T. et al. (2013) Somatic mutations of calreticulin in   myeloproliferative neoplasms. New Engl. J. Med. 588:5027-7412. 2. Haynes Kerns et al. (2013) Somatic CALR mutations in   myeloproliferative neoplasms with nonmutated JAK2. New Engl. J.   Med. [phone].   [name] Director Review 04/17/2018 Comment   Final   Comment: (NOTE) Constance Goltz, PhD, [facility]               Director, [place] for [place], [place]   . JAK2 Exons 12-15 Mut Det PCR: 04/17/2018 Comment   Final   Comment: (NOTE) NEGATIVE JAK2 mutations were not detected in exons 12, 13, 14 and 15. This result does not rule out the presence of JAK2 mutation at a level below the detection sensitivity of this assay, the presence of other mutations outside the analyzed region of the JAK2 gene, or the presence of a myeloproliferative or other neoplasm.  Result must be correlated with other clinical data for the most accurate diagnosis.   . BACKGROUND: 04/17/2018 Comment   Final   Comment: (NOTE) JAK2 V617F mutation is detected in patients with polycythemia vera (95%), essential thrombocythemia (50%) and primary myelofibrosis (50%). A small percentage of JAK2 mutation positive patients (3.3%) contain other non-V617F mutations within exons 12 to 15. The detection of a JAK2 gene mutation aids in the specific diagnosis of a myeloproliferative  neoplasm, and help distinguish this clonal disease from a benign reactive process.   . Method 04/17/2018 Comment   Final   Comment: (NOTE) Total RNA was purified from the provided specimen. The JAK2 gene region covering exons 12 to 15 was subjected to reverse- transcription coupled PCR amplification, and bi-directional sequencing to identify sequence variations. This assay has a sensitivity to detect approximately 15% population of cells containing the JAK2 mutations in a background of non-mutant cells. This test was developed and its performance characteristics determined by [facility]. It has not been cleared or approved by the Food and Drug Administration.   . References 04/17/2018 Comment   Final   Comment: (NOTE) Algasham, N. et al. Detection of mutations in JAK2 exons 12-15 by Sanger sequencing. Int J Lab Hemato. 2015, 38:34-41. Ma W. et al. Mutation profile of JAK2 transcripts in patients with chronic myeloproliferative neoplasias. J Mol Diagn. 2009, 11:49-53.   . DIRECTOR REVIEW: 04/17/2018 Comment   Final   Comment: (NOTE) Dan Wang, PhD, FACMG    Director, Molecular Oncology    [facility]    [place], [place] [address]    [phone]   . MPL MUTATION ANALYSIS RESULT: 04/17/2018 Comment   Final   Comment: (NOTE) No MPL mutation was identified in the provided specimen of this individual. Results should be interpreted in conjunction with clinical and other laboratory findings for the most accurate interpretation.   . BACKGROUND: 04/17/2018 Comment   Final   Comment: (NOTE) MPL (myeloproliferative leukemia virus oncogene homology) belongs to the hematopoietin superfamily and enables its ligand thrombopoietin to facilitate both global hematopoiesis and megakaryocyte growth and differentiation. MPL W515 mutations are present in patients with primary myelofibrosis (PMF) and essential thrombocythemia (ET) at a frequency of  approximately 5% and 1% respectively. The S505 mutation is detected in patients with hereditary thrombocythemia.   . METHODOLOGY: 04/17/2018 Comment   Final   Comment: (NOTE) Genomic DNA was purified from the provided specimen. MPL gene region covering the S505N and W515L/K mutations were subjected to PCR amplification and bi-directional sequencing in duplicate to identify sequence variations. This assay has a sensitivity to detect approximately 20-25% population of cells containing the MPL mutations in a background of non-mutant cells. This assay will not detect the mutation below the sensitivity of this assay. Molecular- based testing is highly accurate, but as in any laboratory test, rare diagnostic errors may occur.   . REFERENCES: 04/17/2018 Comment   Final   Comment: (NOTE) 1. Pardanani AD, et al. (2006). MPL515 mutations in   myeloproliferative and other myeloid disorders: a study   of 1182 patients. Blood 108:3472-3476. 2. Kilpivaara O and Levine RL. (2008). JAK2 and MPL   mutations in myeloproliferative neoplasms: discovery and   science. Leukemia 22:1813-1817. 3. Maurizio KL, et al. (2009). Evidence for a founder effect   of the MPL-S505N mutation in eight [place] pedigrees with   hereditary thrombocythemia. Haematologica 94(10):1368-   1374.   . DIRECTOR REVIEW: 04/17/2018 Comment   Final   Comment: (  NOTE) Loni Muse, PhD, [facility]    Director, [place] for [place], [place] [address]    [phone] This test was developed and its performance characteristics determined by [facility]. It has not been cleared or approved by the Food and Drug Administration.   . Extraction 04/17/2018 Comment   Final   Comment: (NOTE) This sample has been received and DNA extraction has been performed. Performed At: [facility] [address] [place], [place] [id number] Nechama Guard MD JS:[id number] Performed  At: [facility] [place], [place] [id number] Nechama Guard MD RS:[id number]     Assessment:  James Watson is a 77 y.o. male with mild thrombocytopenia dating back to at least 2013/2015 with no significant change over the years.  Peripheral smear on 04/06/2016 revealed mild thrombocytopenia with no abnormal or immature cells.  ANA, lupus anticoagulant, SPEP, hepatitis C antibody were negative.  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  Because of platelet count stability, he deferred bone marrow biopsy.  Platelet count has been followed:  126,000 on 09/11/2012, 139,000 on 04/06/2016, 141,000 on 04/20/2016, 154,000 on 08/18/2016, and 143,000 on 04/18/2017.  Work-up on 04/18/2017 revealed a hematocrit of 48.4, hemoglobin 16.4, MCV 95, platelets 143,000, WBC 3300 with an ANC of 2300.  ALC was 600.  Normal studies included:  LFTs, folate (16.7), B12 (473), TSH, H pylori IgG, and hepatitis B core antibody total.  Copper level was 94 (72-166) on 05/03/2017.    He has a history of B12 deficiency.  B12 level was 248 (low normal) on 04/06/2016.  MMA was 428 (high) on 04/20/2016 and 462 (high) on 04/18/2017 poissibly due to renal insufficiency.  He took B12 IM for 9 months.  He went out of town and the injections stopped.   Anti-parietal antibody and intrinsic factor antibody were negative on 05/03/2018.  He has a history of adenomatous colonic polyps.  Last colonoscopy was in 07/2015.  He has a history of prostate cancer treated with prostatectomy.    He has chronic kidney disease.  Creatinine was 1.81 on 04/18/2017.  Calcium, albumen, and protein were normal.  Myeloma panel on 04/06/2016 revealed no monoclonal protein and normal immunoglobulins. IFE urine on 04/19/2017 revealed 618 mg protein in 24 hours (30-150).  Free light chain excretion rate was 15.5 mg/L (normal), lambda free light chains 0.76 mg/L (normal) with a free kappa/lambda ration 20.39  (2.04-10.37).  He has mild polycythemia.  Hemoglobin was 16.6 on 05/03/2018.  He denies any cardiopulmonary disease, tobacco use, sleep apnea or testosterone use.  Symptomatically, he feels good.  Exam reveals no adenopathy or hepatosplenomegaly.  Plan: 1.  Labs today:  CBC with diff, CMP, B12, folate, SPEP, free light chains.  Collect 24 hour UPEP with free light chains. 2.  Review work-up from last year. 3.  Mild thrombocytopenia  Etiology unclear.  Work-up today. 4.  B12 deficiency  h/o borderline B12 and B12 injections in past  Recheck B12 level today.  MMA likely elevated in the past secondary to renal insufficiency. 5.  Mild erythrocytosis  Patient denies dehydration.  No history of cardiopulmonary disease, tobacco use, sleep apnea.  Check epo level, carbon monoxide level, testosterone, JAK2 with reflex,. 6.  RTC in 2 weeks for MD assessment, review of work-up and discussion regarding direction of therapy.   Lequita Asal, MD  04/17/2018, 4:35 PM

## 2018-04-17 NOTE — Progress Notes (Signed)
Patient here for annual followup.  Patient offers no complaints today.

## 2018-04-18 ENCOUNTER — Other Ambulatory Visit: Payer: Self-pay | Admitting: *Deleted

## 2018-04-18 ENCOUNTER — Telehealth: Payer: Self-pay | Admitting: *Deleted

## 2018-04-18 DIAGNOSIS — E538 Deficiency of other specified B group vitamins: Secondary | ICD-10-CM

## 2018-04-18 LAB — PROTEIN ELECTROPHORESIS, SERUM
A/G Ratio: 1.8 — ABNORMAL HIGH (ref 0.7–1.7)
Albumin ELP: 4.1 g/dL (ref 2.9–4.4)
Alpha-1-Globulin: 0.1 g/dL (ref 0.0–0.4)
Alpha-2-Globulin: 0.8 g/dL (ref 0.4–1.0)
Beta Globulin: 0.8 g/dL (ref 0.7–1.3)
Gamma Globulin: 0.5 g/dL (ref 0.4–1.8)
Globulin, Total: 2.3 g/dL (ref 2.2–3.9)
Total Protein ELP: 6.4 g/dL (ref 6.0–8.5)

## 2018-04-18 LAB — ERYTHROPOIETIN: Erythropoietin: 12.8 m[IU]/mL (ref 2.6–18.5)

## 2018-04-18 LAB — KAPPA/LAMBDA LIGHT CHAINS
Kappa free light chain: 25.2 mg/L — ABNORMAL HIGH (ref 3.3–19.4)
Kappa, lambda light chain ratio: 2.29 — ABNORMAL HIGH (ref 0.26–1.65)
Lambda free light chains: 11 mg/L (ref 5.7–26.3)

## 2018-04-18 LAB — CARBON MONOXIDE, BLOOD (PERFORMED AT REF LAB): Carbon Monoxide, Blood: 2.6 % (ref 0.0–3.6)

## 2018-04-18 LAB — TESTOSTERONE: Testosterone: 257 ng/dL — ABNORMAL LOW (ref 264–916)

## 2018-04-18 NOTE — Telephone Encounter (Signed)
-----   Message from Karen Kitchens, NP sent at 04/18/2018 12:21 PM EST ----- B12 low. Begin oral B12. Recheck B12 level in 1 month. If not normal, we will restart his B12 injections.   [name]

## 2018-04-18 NOTE — Telephone Encounter (Signed)
Called patient to inform him that his B-12 level is low.  MD recommends patient start on oral B-12 1000 mcg daily.  We will recheck labs in one month.  If B-12 is not normal at that time, MD recommends monthly B-12 injections.  Patient verbalized understanding and is in agreement.  Message sent to scheduling.

## 2018-04-20 ENCOUNTER — Other Ambulatory Visit: Payer: Self-pay

## 2018-04-20 DIAGNOSIS — D696 Thrombocytopenia, unspecified: Secondary | ICD-10-CM

## 2018-04-21 ENCOUNTER — Ambulatory Visit
Admission: RE | Admit: 2018-04-21 | Discharge: 2018-04-21 | Disposition: A | Discharge status: Home / Self Care | Payer: Medicare Other | Source: Ambulatory Visit | Attending: Rheumatology | Admitting: Rheumatology

## 2018-04-21 DIAGNOSIS — S73192A Other sprain of left hip, initial encounter: Secondary | ICD-10-CM | POA: Insufficient documentation

## 2018-04-21 DIAGNOSIS — N433 Hydrocele, unspecified: Secondary | ICD-10-CM | POA: Diagnosis not present

## 2018-04-21 DIAGNOSIS — X58XXXA Exposure to other specified factors, initial encounter: Secondary | ICD-10-CM | POA: Diagnosis not present

## 2018-04-21 DIAGNOSIS — M7062 Trochanteric bursitis, left hip: Secondary | ICD-10-CM | POA: Insufficient documentation

## 2018-04-21 DIAGNOSIS — M25552 Pain in left hip: Secondary | ICD-10-CM | POA: Insufficient documentation

## 2018-04-23 LAB — IFE+PROTEIN ELECTRO, 24-HR UR
% BETA, Urine: 6.3 %
ALBUMIN, U: 85.1 %
ALPHA 1 URINE: 1.7 %
Alpha 2, Urine: 1.3 %
GAMMA GLOBULIN URINE: 5.6 %
TOTAL PROTEIN, URINE-UPE24: 13.1 mg/dL
TOTAL PROTEIN, URINE-UR/DAY: 406 mg/(24.h) — AB (ref 30–150)

## 2018-04-27 LAB — CALR + JAK2 E12-15 + MPL (REFLEXED)

## 2018-04-27 LAB — JAK2 V617F, W REFLEX TO CALR/E12/MPL

## 2018-05-08 ENCOUNTER — Ambulatory Visit
Admission: RE | Admit: 2018-05-08 | Discharge: 2018-05-08 | Disposition: A | Discharge status: Home / Self Care | Payer: Medicare Other | Source: Ambulatory Visit | Attending: Urgent Care | Admitting: Urgent Care

## 2018-05-08 ENCOUNTER — Encounter: Payer: Self-pay | Admitting: Hematology and Oncology

## 2018-05-08 ENCOUNTER — Inpatient Hospital Stay: Payer: Medicare Other | Attending: Hematology and Oncology | Admitting: Hematology and Oncology

## 2018-05-08 ENCOUNTER — Inpatient Hospital Stay: Payer: Medicare Other

## 2018-05-08 ENCOUNTER — Other Ambulatory Visit: Payer: Self-pay

## 2018-05-08 VITALS — BP 131/86 | HR 68 | Temp 97.1°F | Resp 18 | Ht 66.0 in | Wt 177.0 lb

## 2018-05-08 DIAGNOSIS — D751 Secondary polycythemia: Secondary | ICD-10-CM | POA: Diagnosis not present

## 2018-05-08 DIAGNOSIS — E1122 Type 2 diabetes mellitus with diabetic chronic kidney disease: Secondary | ICD-10-CM | POA: Insufficient documentation

## 2018-05-08 DIAGNOSIS — D696 Thrombocytopenia, unspecified: Secondary | ICD-10-CM | POA: Diagnosis present

## 2018-05-08 DIAGNOSIS — N189 Chronic kidney disease, unspecified: Secondary | ICD-10-CM | POA: Diagnosis not present

## 2018-05-08 DIAGNOSIS — R0609 Other forms of dyspnea: Secondary | ICD-10-CM | POA: Insufficient documentation

## 2018-05-08 DIAGNOSIS — I129 Hypertensive chronic kidney disease with stage 1 through stage 4 chronic kidney disease, or unspecified chronic kidney disease: Secondary | ICD-10-CM | POA: Insufficient documentation

## 2018-05-08 DIAGNOSIS — E538 Deficiency of other specified B group vitamins: Secondary | ICD-10-CM

## 2018-05-08 DIAGNOSIS — Z8673 Personal history of transient ischemic attack (TIA), and cerebral infarction without residual deficits: Secondary | ICD-10-CM | POA: Insufficient documentation

## 2018-05-08 DIAGNOSIS — I700 Atherosclerosis of aorta: Secondary | ICD-10-CM | POA: Diagnosis not present

## 2018-05-08 DIAGNOSIS — Z79899 Other long term (current) drug therapy: Secondary | ICD-10-CM | POA: Diagnosis not present

## 2018-05-08 LAB — CBC WITH DIFFERENTIAL/PLATELET
Abs Immature Granulocytes: 0.01 10*3/uL (ref 0.00–0.07)
BASOS ABS: 0 10*3/uL (ref 0.0–0.1)
BASOS PCT: 1 %
EOS ABS: 0.1 10*3/uL (ref 0.0–0.5)
Eosinophils Relative: 3 %
HCT: 54.3 % — ABNORMAL HIGH (ref 39.0–52.0)
Hemoglobin: 18 g/dL — ABNORMAL HIGH (ref 13.0–17.0)
Immature Granulocytes: 0 %
Lymphocytes Relative: 18 %
Lymphs Abs: 0.8 10*3/uL (ref 0.7–4.0)
MCH: 31.6 pg (ref 26.0–34.0)
MCHC: 33.1 g/dL (ref 30.0–36.0)
MCV: 95.4 fL (ref 80.0–100.0)
MONO ABS: 0.4 10*3/uL (ref 0.1–1.0)
Monocytes Relative: 9 %
NRBC: 0 % (ref 0.0–0.2)
Neutro Abs: 3.3 10*3/uL (ref 1.7–7.7)
Neutrophils Relative %: 69 %
PLATELETS: 165 10*3/uL (ref 150–400)
RBC: 5.69 MIL/uL (ref 4.22–5.81)
RDW: 12.4 % (ref 11.5–15.5)
WBC: 4.7 10*3/uL (ref 4.0–10.5)

## 2018-05-08 LAB — VITAMIN B12: VITAMIN B 12: 241 pg/mL (ref 180–914)

## 2018-05-08 LAB — FERRITIN: Ferritin: 102 ng/mL (ref 24–336)

## 2018-05-08 LAB — IRON AND TIBC
IRON: 79 ug/dL (ref 45–182)
Saturation Ratios: 23 % (ref 17.9–39.5)
TIBC: 343 ug/dL (ref 250–450)
UIBC: 264 ug/dL

## 2018-05-08 NOTE — Progress Notes (Signed)
No new changes noted today 

## 2018-05-08 NOTE — Progress Notes (Signed)
[facility] day:  05/08/2018   Chief Complaint: James Watson is a 77 y.o. male with a history of chronic thrombocytopenia, renal insufficiency, and mild erythrocytosis who is seen for 3 week assessment, review of work-up, and discussion regarding direction of therapy.  HPI:   The patient was last seen in the hematology clinic on 04/17/2018 for 1 year assessment.  He had a history of mild thrombocytopenia.  He had a history of B12 deficiency.  He has chronic renal insufficiency.  Labs revealed mild erythrocytosis.  He denied tobacco use, sleep apnea or testosterone use.  He underwent a work-up.  CBC revealed a hematocrit of 51.1, hemoglobin 17.1, MCV 94.5, platelets 153,000, WBC 4300 with an ANC of 2900.  CMP revealed a creatinine of 1.72 (CrCl 36 ml/min).  LFTs were normal.  B12 was 227 (low).  Folate was 11.6.  SPEP revealed no monoclonal protein.  Kappa free light chains were 25.2, lambda free light chains 11.0 with a ratio of 2.29 (0.26 - 1.65).  Epo level was 12.8 (normal).  Carbon monoxide level was 2.6 (normal).  Testosterone was 257.  JAK2, exon 12-15, CALR, and MPL were negative.  24 hour UPEP revealed no monoclonal protein.    There was 406 mg protein/24 hours.  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  He has chronic left hip pain.  Left hip MRI on 04/21/2018 revealed no acute osseous abnormality or significant degenerative changes.  There was no evidence of avascular necrosis.  There was a left anterior superior labral tear, right superior labral tear, minimal left greater trochanteric bursitis, and moderate bilateral hydroceles.  During the interim, patient is doing well overall. He complains of chronic pain in his lower back and LEFT hip. He sees a neuromuscular therapist, which has made his pain more manageable. Patient with slight exertional dyspnea. He maintains a good quality of life. He is still able to play golf on a regular basis.     Past Medical History:  Diagnosis Date  . Adenomatous colon polyp 06/18/2015  . Basal cell carcinoma   . BCC (basal cell carcinoma)    followed by Dr. Evorn Gong   . Chronic renal insufficiency   . Diabetes mellitus without complication ([place])   . Hypercholesteremia   . Hypertension   . Osteoarthritis   . Prostate cancer ([place])   . Squamous cell carcinoma   . TIA (transient ischemic attack)     Past Surgical History:  Procedure Laterality Date  . COLONOSCOPY WITH PROPOFOL N/A 07/16/2015   Procedure: COLONOSCOPY WITH PROPOFOL;  Surgeon: Manya Silvas, MD;  Location: [facility] ENDOSCOPY;  Service: Endoscopy;  Laterality: N/A;  . left shoulder arthroscopy    . mtp repair    . PROSTATECTOMY    . tm repair      History reviewed. No pertinent family history.  Social History:  reports that he has never smoked. He has never used smokeless tobacco. He reports that he drinks about 2.0 standard drinks of alcohol per week. His drug history is not on file.  He is a retired Pharmacist, community.  He lives in [place].  He likes to golf.  He travels frequently.  The patient is alone today.  Allergies:  Allergies  Allergen Reactions  . Enalapril Maleate Other (See Comments)    Difficulty breathing  . Hydrocodone Other (See Comments)  . Hydrocodone-Acetaminophen Nausea Only    Current Medications: Current Outpatient Medications  Medication Sig Dispense Refill  .  amLODipine (NORVASC) 10 MG tablet TAKE 1 TABLET (5 MG TOTAL) BY MOUTH ONCE DAILY.    [name] aspirin EC 81 MG tablet Take by mouth.    [name] atorvastatin (LIPITOR) 80 MG tablet TAKE 1/2 TABLET BY MOUTH EVERY NIGHT  1  . Blood Glucose Monitoring Suppl (ONE TOUCH ULTRA 2) W/DEVICE KIT USE AS DIRECTED. DX: E11.22, N18.2  0  . celecoxib (CELEBREX) 100 MG capsule Take by mouth daily as needed. Reported on 07/15/2015    . Cholecalciferol (VITAMIN D-1000 MAX ST) 1000 UNITS tablet Take by mouth.    . Cyanocobalamin (B-12 COMPLIANCE INJECTION) 1000 MCG/ML KIT 1 mg  Alcorn once daily for 7 days,then once a week x4 weeks, then once a month. 20 kit 2  . glimepiride (AMARYL) 1 MG tablet TAKE 1 TABLET (1 MG TOTAL) BY MOUTH DAILY WITH BREAKFAST.  3  . losartan (COZAAR) 100 MG tablet TAKE 1 TABLET (100 MG TOTAL) BY MOUTH ONCE DAILY.  1  . ONE TOUCH ULTRA TEST test strip USE AS DIRECTED. CHECK CBG'S FASTING ONCE DAILY. DX: E11.22, N18.2  11  . ONETOUCH DELICA LANCETS FINE MISC Use 1 Units as directed. Check CBG's fasting once daily. Dx: E11.22, N18.2     No current facility-administered medications for this visit.     Review of Systems:  GENERAL:  Feels "good".  No fevers, sweats.  Weight stable. PERFORMANCE STATUS (ECOG):  1 HEENT:  No visual changes, runny nose, sore throat, mouth sores or tenderness. Lungs: Mild shortness of breath on exertion.  No cough.  No hemoptysis. Cardiac:  No chest pain, palpitations, orthopnea, or PND. GI:  No nausea, vomiting, diarrhea, constipation, melena or hematochezia. GU:  No urgency, frequency, dysuria, or hematuria. Musculoskeletal:  Chronic lower back and left hip pain. No muscle tenderness. Extremities:  No pain or swelling. Skin:  No rashes or skin changes. Neuro:  No headache, numbness or weakness, balance or coordination issues. Endocrine:  Diabetes.  No thyroid issues, hot flashes or night sweats. Psych:  No mood changes, depression or anxiety. Pain:  No focal pain. Review of systems:  All other systems reviewed and found to be negative.   Physical Exam: Blood pressure 131/86, pulse 68, temperature (!) 97.1 F (36.2 C), temperature source Tympanic, resp. rate 18, height _0  (1.676 m), weight 177 lb (80.3 kg), SpO2 94 %. GENERAL:  Well developed, well nourished, gentleman sitting comfortably in the exam room in no acute distress. MENTAL STATUS:  Alert and oriented to person, place and time. HEAD:  Short gray hair.  Normocephalic, atraumatic, face symmetric, no Cushingoid features. EYES:  Glasses.  Blue eyes.  No  conjunctivitis or scleral icterus. NEUROLOGICAL: Unremarkable. PSYCH:  Appropriate.    No visits with results within 3 Day(s) from this visit.  Latest known visit with results is:  Orders Only on 04/20/2018  Component Date Value Ref Range Status  . Total Protein, Urine 04/20/2018 13.1  Not Estab. mg/dL Final  . Total Protein, Urine-Ur/day 04/20/2018 406* 30 - 150 mg/24 hr Final  . Albumin, U 04/20/2018 85.1  % Final  . ALPHA 1 URINE 04/20/2018 1.7  % Final  . Alpha 2, Urine 04/20/2018 1.3  % Final  . % BETA, Urine 04/20/2018 6.3  % Final  . GAMMA GLOBULIN URINE 04/20/2018 5.6  % Final  . M-SPIKE %, Urine 04/20/2018 Not Observed  Not Observed % Final  . Immunofixation Result, Urine 04/20/2018 Comment   Final   Bence Jones Protein positive; kappa type.  [name]  Note: 04/20/2018 Comment   Final   Comment: (NOTE) Protein electrophoresis scan will follow via computer, mail, or courier delivery. Performed At: [facility] [place], [place] [id number] Rush Farmer MD [id number]   . Total Volume 04/20/2018 TV 3100 ML   Final   Performed at [facility], [address] [place], [place] [address]    Assessment:  SONYA PUCCI Watson is a 77 y.o. male with mild thrombocytopenia dating back to at least 2013/2015 with no significant change over the years.  Peripheral smear on 04/06/2016 revealed mild thrombocytopenia with no abnormal or immature cells.  ANA, lupus anticoagulant, SPEP, hepatitis C antibody were negative.  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  Because of platelet count stability, he deferred bone marrow biopsy.  Platelet count has been followed:  126,000 on 09/11/2012, 139,000 on 04/06/2016, 141,000 on 04/20/2016, 154,000 on 08/18/2016, 143,000 on 04/18/2017, 135,000 on 05/03/2017, and 153,000 on 04/17/2018.  Work-up on 04/18/2017 revealed a hematocrit of 48.4, hemoglobin 16.4, MCV 95, platelets 143,000, WBC 3300 with an ANC  of 2300.  ALC was 600.  Normal studies included:  LFTs, folate (16.7), B12 (473), TSH, H pylori IgG, and hepatitis B core antibody total.  Copper level was 94 (72-166) on 05/03/2017.    Work-up on 04/17/2018 revealed a hematocrit of 51.1, hemoglobin 17.1, MCV 94.5, platelets 153,000, WBC 4300 with an ANC of 2900.  Creatinine was 1.72 (CrCl 36 ml/min).  B12 was 227 (low).  Normal studies included:  LFTs, folate (11.6), SPEP, epo level (12.8), carbon monoxide level (2.6), testosterone (257), JAK2, exon 12-15, CALR, and MPL.  Kappa free light chains were 25.2, lambda free light chains 11.0 with a ratio of 2.29 (0.26 - 1.65).  24 hour UPEP revealed no monoclonal protein.    There was 406 mg protein/24 hours.  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  He has a history of B12 deficiency.  B12 level was 248 (low normal) on 04/06/2016.  MMA was 428 (high) on 04/20/2016 and 462 (high) on 04/18/2017 poissibly due to renal insufficiency.  He took B12 IM for 9 months.  He went out of town and the injections stopped.   Anti-parietal antibody and intrinsic factor antibody were negative on 05/03/2018.  He has a history of adenomatous colonic polyps.  Last colonoscopy was in 07/2015.  He has a history of prostate cancer treated with prostatectomy.    He has chronic kidney disease.  Creatinine was 1.81 on 04/18/2017 and 1.72 on 04/07/2018.  Calcium, albumen, and protein were normal.  Myeloma panel on 04/06/2016 revealed no monoclonal protein and normal immunoglobulins. SPEP on 04/17/2018 revealed no monoclonal protein.  IFE urine on 04/19/2017 revealed 618 mg protein in 24 hours (30-150).  Free light chain excretion rate was 15.5 mg/L (normal), lambda free light chains 0.76 mg/L (normal) with a free kappa/lambda ratio 20.39 (2.04-10.37).  24 hour UPEP revealed no monoclonal protein.  There was 406 mg protein/24 hours.  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  He has mild polycythemia.  Hemoglobin was 16.6 on 05/03/2018.   He denies any cardiopulmonary disease, tobacco use, sleep apnea or testosterone use.  Symptomatically, he feels good.  Exam is stable.  Plan: 1.  Review work-up to date. 2.  Labs today:  CBC with diff, ferritin, iron studies, BCR-ABL. 3.  Mild thrombocytopenia:  Resolved.  Platelet count was normal (153,000) on 04/17/2018. 4.  B12 deficiency:  B12 level was 227 (low) on 04/17/2018.  Patient plans to start oral B12 today. 5.  Mild erythrocytosis:  Check O2 sats- 94% with desat to 91%.  CXR (PA and lateral).  Epo level, carbon monoxide level, testosterone, and JAK2 with reflex are negative. 6.  RTC in 1 month for MD assessment and labs (CBC with diff, B12).  Addendum:  CXR today reveals no acute cardiopulmonary abnormalities.   Honor Loh, NP  05/08/2018, 11:55 AM  I saw and evaluated the patient, participating in the key portions of the service and reviewing pertinent diagnostic studies and records.  I reviewed the nurse practitioner's note and agree with the findings and the plan.  The assessment and plan were discussed with the patient.  Multiple questions were asked by the patient and answered.   Nolon Stalls, MD 05/08/2018,11:55 AM

## 2018-05-16 ENCOUNTER — Inpatient Hospital Stay: Payer: Medicare Other

## 2018-05-16 VITALS — BP 124/76 | HR 76 | Resp 20

## 2018-05-16 DIAGNOSIS — D696 Thrombocytopenia, unspecified: Secondary | ICD-10-CM | POA: Diagnosis not present

## 2018-05-16 DIAGNOSIS — D751 Secondary polycythemia: Secondary | ICD-10-CM

## 2018-05-17 LAB — BCR-ABL1 FISH
Cells Analyzed: 200
Cells Counted: 200

## 2018-05-18 ENCOUNTER — Other Ambulatory Visit: Payer: Medicare Other

## 2018-05-31 ENCOUNTER — Other Ambulatory Visit: Payer: Medicare Other

## 2018-06-11 ENCOUNTER — Encounter: Payer: Self-pay | Admitting: Hematology and Oncology

## 2018-06-11 ENCOUNTER — Inpatient Hospital Stay: Payer: Medicare Other | Attending: Hematology and Oncology

## 2018-06-11 ENCOUNTER — Inpatient Hospital Stay (HOSPITAL_BASED_OUTPATIENT_CLINIC_OR_DEPARTMENT_OTHER): Payer: Medicare Other | Admitting: Hematology and Oncology

## 2018-06-11 ENCOUNTER — Inpatient Hospital Stay: Payer: Medicare Other

## 2018-06-11 VITALS — BP 113/70 | HR 73 | Temp 97.1°F | Resp 18 | Ht 66.0 in | Wt 177.0 lb

## 2018-06-11 DIAGNOSIS — Z79899 Other long term (current) drug therapy: Secondary | ICD-10-CM | POA: Insufficient documentation

## 2018-06-11 DIAGNOSIS — E1122 Type 2 diabetes mellitus with diabetic chronic kidney disease: Secondary | ICD-10-CM

## 2018-06-11 DIAGNOSIS — D751 Secondary polycythemia: Secondary | ICD-10-CM | POA: Diagnosis not present

## 2018-06-11 DIAGNOSIS — N189 Chronic kidney disease, unspecified: Secondary | ICD-10-CM | POA: Diagnosis not present

## 2018-06-11 DIAGNOSIS — E538 Deficiency of other specified B group vitamins: Secondary | ICD-10-CM

## 2018-06-11 DIAGNOSIS — D696 Thrombocytopenia, unspecified: Secondary | ICD-10-CM | POA: Diagnosis present

## 2018-06-11 DIAGNOSIS — I129 Hypertensive chronic kidney disease with stage 1 through stage 4 chronic kidney disease, or unspecified chronic kidney disease: Secondary | ICD-10-CM | POA: Insufficient documentation

## 2018-06-11 LAB — CBC WITH DIFFERENTIAL/PLATELET
Abs Immature Granulocytes: 0.01 10*3/uL (ref 0.00–0.07)
Basophils Absolute: 0.1 10*3/uL (ref 0.0–0.1)
Basophils Relative: 1 %
Eosinophils Absolute: 0.3 10*3/uL (ref 0.0–0.5)
Eosinophils Relative: 6 %
HCT: 47.5 % (ref 39.0–52.0)
Hemoglobin: 16.2 g/dL (ref 13.0–17.0)
Immature Granulocytes: 0 %
Lymphocytes Relative: 19 %
Lymphs Abs: 0.9 10*3/uL (ref 0.7–4.0)
MCH: 31.8 pg (ref 26.0–34.0)
MCHC: 34.1 g/dL (ref 30.0–36.0)
MCV: 93.1 fL (ref 80.0–100.0)
Monocytes Absolute: 0.4 10*3/uL (ref 0.1–1.0)
Monocytes Relative: 8 %
Neutro Abs: 3.1 10*3/uL (ref 1.7–7.7)
Neutrophils Relative %: 66 %
Platelets: 154 10*3/uL (ref 150–400)
RBC: 5.1 MIL/uL (ref 4.22–5.81)
RDW: 13 % (ref 11.5–15.5)
WBC: 4.7 10*3/uL (ref 4.0–10.5)
nRBC: 0 % (ref 0.0–0.2)

## 2018-06-11 LAB — VITAMIN B12: Vitamin B-12: 442 pg/mL (ref 180–914)

## 2018-06-11 NOTE — Progress Notes (Signed)
[facility] day:  06/11/2018   Chief Complaint: James Watson is a 78 y.o. male with a history of chronic thrombocytopenia, renal insufficiency, and mild erythrocytosis who is seen for 1 month assessment.  HPI:   The patient was last seen in the hematology clinic on 05/08/2018.  At that time, work-up was reviewed.  CBC revealed a hematocrit of 51.1, hemoglobin 17.1, MCV 94.5, platelets 153,000, WBC 4300 with an ANC of 2900.  B12 was 227 (low).  Kappa free light chains were 25.2, lambda free light chains 11.0 with a ratio of 2.29 (0.26 - 1.65).  Epo level, carbon monoxide level, testosterone, and JAK2, exon 12-15, CALR, and MPL were negative.  24 hour UPEP revealed Bence Jones kappa type protein.  CXR revealed no cardiopulmonary disease.  He was noted to desaturate on ambulation.  Additional labs noted a normal ferritin (102) , iron saturation (23%), and BCR-ABL.  During the interim, he has felt "good, I guess".  He has been taking B12 1000 mcg orally for the past month.  He notes being a little short of breath with ambulation.  He described increased shortness of breath climbing stair.  He is able to use the treadmill and golf "ok".   Past Medical History:  Diagnosis Date  . Adenomatous colon polyp 06/18/2015  . Basal cell carcinoma   . BCC (basal cell carcinoma)    followed by Dr. Evorn Gong   . Chronic renal insufficiency   . Diabetes mellitus without complication ([place])   . Hypercholesteremia   . Hypertension   . Osteoarthritis   . Prostate cancer ([place])   . Squamous cell carcinoma   . TIA (transient ischemic attack)     Past Surgical History:  Procedure Laterality Date  . COLONOSCOPY WITH PROPOFOL N/A 07/16/2015   Procedure: COLONOSCOPY WITH PROPOFOL;  Surgeon: Manya Silvas, MD;  Location: [facility] ENDOSCOPY;  Service: Endoscopy;  Laterality: N/A;  . left shoulder arthroscopy    . mtp repair    . PROSTATECTOMY    . tm repair       History reviewed. No pertinent family history.  Social History:  reports that he has never smoked. He has never used smokeless tobacco. He reports current alcohol use of about 2.0 standard drinks of alcohol per week. No history on file for drug.  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  He likes to golf.  He travels frequently.  The patient is alone today.  Allergies:  Allergies  Allergen Reactions  . Enalapril Maleate Other (See Comments)    Difficulty breathing  . Hydrocodone Other (See Comments)  . Hydrocodone-Acetaminophen Nausea Only    Current Medications: Current Outpatient Medications  Medication Sig Dispense Refill  . amLODipine (NORVASC) 10 MG tablet TAKE 1 TABLET (5 MG TOTAL) BY MOUTH ONCE DAILY.    [name] aspirin EC 81 MG tablet Take by mouth.    [name] atorvastatin (LIPITOR) 80 MG tablet TAKE 1/2 TABLET BY MOUTH EVERY NIGHT  1  . Cholecalciferol (VITAMIN D-1000 MAX ST) 1000 UNITS tablet Take by mouth.    . Cyanocobalamin (B-12 COMPLIANCE INJECTION) 1000 MCG/ML KIT 1 mg [place] once daily for 7 days,then once a week x4 weeks, then once a month. 20 kit 2  . glimepiride (AMARYL) 1 MG tablet TAKE 1 TABLET (1 MG TOTAL) BY MOUTH DAILY WITH BREAKFAST.  3  . losartan (COZAAR) 100 MG tablet TAKE 1 TABLET (100 MG TOTAL) BY  MOUTH ONCE DAILY.  1  . Blood Glucose Monitoring Suppl (ONE TOUCH ULTRA 2) W/DEVICE KIT USE AS DIRECTED. DX: E11.22, N18.2  0  . celecoxib (CELEBREX) 100 MG capsule Take by mouth daily as needed. Reported on 07/15/2015    . ONE TOUCH ULTRA TEST test strip USE AS DIRECTED. CHECK CBG'S FASTING ONCE DAILY. DX: E11.22, N18.2  11  . ONETOUCH DELICA LANCETS FINE MISC Use 1 Units as directed. Check CBG's fasting once daily. Dx: E11.22, N18.2     No current facility-administered medications for this visit.     Review of Systems:  GENERAL:  Feels "good, I guess".  No fevers, sweats or weight loss.  Weight stable. PERFORMANCE STATUS (ECOG):  1 HEENT:  No visual changes,  runny nose, sore throat, mouth sores or tenderness. Lungs: Shortness of breath with exertion.  No cough.  No hemoptysis. Cardiac:  No chest pain, palpitations, orthopnea, or PND. GI:  No nausea, vomiting, diarrhea, constipation, melena or hematochezia. GU:  No urgency, frequency, dysuria, or hematuria. Musculoskeletal:  Chronic back and left hip pain.  No muscle tenderness. Extremities:  No pain or swelling. Skin:  No rashes or skin changes. Neuro:  No headache, numbness or weakness, balance or coordination issues. Endocrine:  Diabetes.  No thyroid issues, hot flashes or night sweats. Psych:  No mood changes, depression or anxiety. Pain:  No focal pain. Review of systems:  All other systems reviewed and found to be negative.   Physical Exam: Blood pressure 113/70, pulse 73, temperature (!) 97.1 F (36.2 C), temperature source Tympanic, resp. rate 18, height _0  (1.676 m), weight 177 lb (80.3 kg), SpO2 95 %. GENERAL:  Well developed, well nourished, gentleman sitting comfortably in the exam room in no acute distress. MENTAL STATUS:  Alert and oriented to person, place and time. HEAD:  Short gray hair.  Normocephalic, atraumatic, face symmetric, no Cushingoid features. EYES:  Glasses.  Blue eyes.  Pupils equal round and reactive to light and accomodation.  No conjunctivitis or scleral icterus. ENT:  Oropharynx clear without lesion.  Tongue normal. Mucous membranes moist.  RESPIRATORY:  Clear to auscultation without rales, wheezes or rhonchi. CARDIOVASCULAR:  Regular rate and rhythm without murmur, rub or gallop. ABDOMEN:  Soft, non-tender, with active bowel sounds, and no hepatosplenomegaly.  No masses. SKIN:  No rashes, ulcers or lesions. EXTREMITIES: No edema, no skin discoloration or tenderness.  No palpable cords. LYMPH NODES: No palpable cervical, supraclavicular, axillary or inguinal adenopathy  NEUROLOGICAL: Unremarkable. PSYCH:  Appropriate.   Appointment on 06/11/2018   Component Date Value Ref Range Status  . Vitamin B-12 06/11/2018 442  180 - 914 pg/mL Final   Comment: (NOTE) This assay is not validated for testing neonatal or myeloproliferative syndrome specimens for Vitamin B12 levels. Performed at [facility], [place] [address] [place], [place] [address]   . WBC 06/11/2018 4.7  4.0 - 10.5 K/uL Final  . RBC 06/11/2018 5.10  4.22 - 5.81 MIL/uL Final  . Hemoglobin 06/11/2018 16.2  13.0 - 17.0 g/dL Final  . HCT 06/11/2018 47.5  39.0 - 52.0 % Final  . MCV 06/11/2018 93.1  80.0 - 100.0 fL Final  . MCH 06/11/2018 31.8  26.0 - 34.0 pg Final  . MCHC 06/11/2018 34.1  30.0 - 36.0 g/dL Final  . RDW 06/11/2018 13.0  11.5 - 15.5 % Final  . Platelets 06/11/2018 154  150 - 400 K/uL Final  . nRBC 06/11/2018 0.0  0.0 - 0.2 % Final  .  Neutrophils Relative % 06/11/2018 66  % Final  . Neutro Abs 06/11/2018 3.1  1.7 - 7.7 K/uL Final  . Lymphocytes Relative 06/11/2018 19  % Final  . Lymphs Abs 06/11/2018 0.9  0.7 - 4.0 K/uL Final  . Monocytes Relative 06/11/2018 8  % Final  . Monocytes Absolute 06/11/2018 0.4  0.1 - 1.0 K/uL Final  . Eosinophils Relative 06/11/2018 6  % Final  . Eosinophils Absolute 06/11/2018 0.3  0.0 - 0.5 K/uL Final  . Basophils Relative 06/11/2018 1  % Final  . Basophils Absolute 06/11/2018 0.1  0.0 - 0.1 K/uL Final  . Immature Granulocytes 06/11/2018 0  % Final  . Abs Immature Granulocytes 06/11/2018 0.01  0.00 - 0.07 K/uL Final   Performed at [facility], [address] [place], [place] [address]    Assessment:  MICHAI DIEPPA Watson is a 78 y.o. male with mild thrombocytopenia dating back to at least 2013/2015 with no significant change over the years.  Peripheral smear on 04/06/2016 revealed mild thrombocytopenia with no abnormal or immature cells.  ANA, lupus anticoagulant, SPEP, hepatitis C antibody were negative.  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  Because of platelet count stability, he  deferred bone marrow biopsy.  Platelet count has been followed:  126,000 on 09/11/2012, 139,000 on 04/06/2016, 141,000 on 04/20/2016, 154,000 on 08/18/2016, 143,000 on 04/18/2017, 135,000 on 05/03/2017, and 153,000 on 04/17/2018.  Work-up on 04/18/2017 revealed a hematocrit of 48.4, hemoglobin 16.4, MCV 95, platelets 143,000, WBC 3300 with an ANC of 2300.  ALC was 600.  Normal studies included:  LFTs, folate (16.7), B12 (473), TSH, H pylori IgG, and hepatitis B core antibody total.  Copper level was 94 (72-166) on 05/03/2017.    Work-up on 04/17/2018 revealed a hematocrit of 51.1, hemoglobin 17.1, MCV 94.5, platelets 153,000, WBC 4300 with an ANC of 2900.  Creatinine was 1.72 (CrCl 36 ml/min).  B12 was 227 (low).  Normal studies included:  LFTs, folate (11.6), SPEP, epo level (12.8), carbon monoxide level (2.6), testosterone (257), JAK2, exon 12-15, CALR, and MPL.  Kappa free light chains were 25.2, lambda free light chains 11.0 with a ratio of 2.29 (0.26 - 1.65).  24 hour UPEP revealed no monoclonal protein.    There was 406 mg protein/24 hours.  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  Ferritin, iron studies and BCR-ABL were normal on 05/08/2018.  He has a history of B12 deficiency.  B12 level was 248 (low normal) on 04/06/2016.  MMA was 428 (high) on 04/20/2016 and 462 (high) on 04/18/2017 poissibly due to renal insufficiency.  He took B12 IM for 9 months.  He went out of town and the injections stopped.   Anti-parietal antibody and intrinsic factor antibody were negative on 05/03/2018.  He began oral B12 on 05/08/2018.  He has a history of adenomatous colonic polyps.  Last colonoscopy was in 07/2015.  He has a history of prostate cancer treated with prostatectomy.    He has chronic kidney disease.  Creatinine was 1.81 on 04/18/2017 and 1.72 on 04/07/2018.  Calcium, albumen, and protein were normal.  Myeloma panel on 04/06/2016 revealed no monoclonal protein and normal immunoglobulins. SPEP on  04/17/2018 revealed no monoclonal protein.  IFE urine on 04/19/2017 revealed 618 mg protein in 24 hours (30-150).  Free light chain excretion rate was 15.5 mg/L (normal), lambda free light chains 0.76 mg/L (normal) with a free kappa/lambda ratio 20.39 (2.04-10.37).  24 hour UPEP revealed no  monoclonal protein.  There was 406 mg protein/24 hours.  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  He has mild polycythemia.  Hemoglobin was 16.6 on 05/03/2018.  He denies any cardiopulmonary disease, tobacco use, sleep apnea or testosterone use.  CXR on 05/08/2018 revealed no acute cardiopulmonary abnormalities.  Symptomatically, he has shortness of breath with exertion.  Oxygen saturations decrease with ambulation.  Exam is unremarkable.  Plan: 1.  Labs today:  CBC with diff, B12. 2.  Mild thrombocytopenia:  Platelet count normal on 04/17/2018.. 3.  B12 deficiency:  B12 level was 227 (low) on 04/17/2018.  Patient has been on oral B12 x 1 month.  Recheck B12 today.  Call patient with result. 4.  Mild erythrocytosis:  Patient notes shortness of breath with exertion (ambulation, stair climbing).  Oxygen saturation 94% with desaturation to 91% with ambulation.  CXR (PA and lateral) on 05/08/2018 revealed no acute cardiopulmonary abnormalities.  Epo level, carbon monoxide level, testosterone, and JAK2 with reflex are negative.  Discuss consideration of pulmonary evaluation. 5.  RTC in 1 month for MD assessment, labs (CBC with diff), and +/- phlebotomy.   Lequita Asal, MD  06/11/2018, 4:35 PM

## 2018-07-09 ENCOUNTER — Ambulatory Visit: Payer: Medicare Other | Admitting: Hematology and Oncology

## 2018-07-09 ENCOUNTER — Other Ambulatory Visit: Payer: Medicare Other

## 2018-07-17 ENCOUNTER — Inpatient Hospital Stay: Payer: Medicare Other

## 2018-07-17 ENCOUNTER — Ambulatory Visit: Payer: Medicare Other | Admitting: Hematology and Oncology

## 2018-07-17 ENCOUNTER — Inpatient Hospital Stay: Payer: Medicare Other | Attending: Hematology and Oncology

## 2018-07-17 ENCOUNTER — Encounter: Payer: Self-pay | Admitting: Hematology and Oncology

## 2018-07-17 ENCOUNTER — Inpatient Hospital Stay (HOSPITAL_BASED_OUTPATIENT_CLINIC_OR_DEPARTMENT_OTHER): Payer: Medicare Other | Admitting: Hematology and Oncology

## 2018-07-17 ENCOUNTER — Other Ambulatory Visit: Payer: Medicare Other

## 2018-07-17 VITALS — BP 126/69 | HR 59 | Temp 97.6°F | Resp 18 | Ht 66.0 in | Wt 181.9 lb

## 2018-07-17 DIAGNOSIS — E119 Type 2 diabetes mellitus without complications: Secondary | ICD-10-CM

## 2018-07-17 DIAGNOSIS — E538 Deficiency of other specified B group vitamins: Secondary | ICD-10-CM | POA: Insufficient documentation

## 2018-07-17 DIAGNOSIS — D751 Secondary polycythemia: Secondary | ICD-10-CM | POA: Insufficient documentation

## 2018-07-17 DIAGNOSIS — I10 Essential (primary) hypertension: Secondary | ICD-10-CM

## 2018-07-17 DIAGNOSIS — D696 Thrombocytopenia, unspecified: Secondary | ICD-10-CM | POA: Diagnosis not present

## 2018-07-17 DIAGNOSIS — N189 Chronic kidney disease, unspecified: Secondary | ICD-10-CM

## 2018-07-17 LAB — CBC WITH DIFFERENTIAL/PLATELET
Abs Immature Granulocytes: 0.01 10*3/uL (ref 0.00–0.07)
Basophils Absolute: 0.1 10*3/uL (ref 0.0–0.1)
Basophils Relative: 1 %
Eosinophils Absolute: 0.3 10*3/uL (ref 0.0–0.5)
Eosinophils Relative: 5 %
HCT: 48.7 % (ref 39.0–52.0)
Hemoglobin: 16.5 g/dL (ref 13.0–17.0)
Immature Granulocytes: 0 %
Lymphocytes Relative: 19 %
Lymphs Abs: 0.9 10*3/uL (ref 0.7–4.0)
MCH: 32.2 pg (ref 26.0–34.0)
MCHC: 33.9 g/dL (ref 30.0–36.0)
MCV: 95.1 fL (ref 80.0–100.0)
Monocytes Absolute: 0.4 10*3/uL (ref 0.1–1.0)
Monocytes Relative: 9 %
Neutro Abs: 3.1 10*3/uL (ref 1.7–7.7)
Neutrophils Relative %: 66 %
Platelets: 143 10*3/uL — ABNORMAL LOW (ref 150–400)
RBC: 5.12 MIL/uL (ref 4.22–5.81)
RDW: 13.1 % (ref 11.5–15.5)
WBC: 4.7 10*3/uL (ref 4.0–10.5)
nRBC: 0 % (ref 0.0–0.2)

## 2018-07-17 NOTE — Progress Notes (Signed)
No new changes noted today 

## 2018-07-17 NOTE — Progress Notes (Addendum)
[facility] day:  07/17/2018  Chief Complaint: James Watson is a 78 y.o. male with a history of chronic thrombocytopenia, renal insufficiency, and mild erythrocytosis who is seen for 1 month assessment.  HPI:   The patient was last seen in the hematology clinic on 06/11/2018.  At that time, he had shortness of breath with exertion.  Oxygen saturations decreased with ambulation.  Exam was unremarkable.  He had been on oral B12 x 1 month.  B12 was  442.  During the interim, he notes that he is breathing "the same".  He feels winded with steep steps, but recovers quickly.  He denies any chest pain.   Past Medical History:  Diagnosis Date  . Adenomatous colon polyp 06/18/2015  . Basal cell carcinoma   . BCC (basal cell carcinoma)    followed by Dr. Evorn Gong   . Chronic renal insufficiency   . Diabetes mellitus without complication ([place])   . Hypercholesteremia   . Hypertension   . Osteoarthritis   . Prostate cancer ([place])   . Squamous cell carcinoma   . TIA (transient ischemic attack)     Past Surgical History:  Procedure Laterality Date  . COLONOSCOPY WITH PROPOFOL N/A 07/16/2015   Procedure: COLONOSCOPY WITH PROPOFOL;  Surgeon: Manya Silvas, MD;  Location: [facility] ENDOSCOPY;  Service: Endoscopy;  Laterality: N/A;  . left shoulder arthroscopy    . mtp repair    . PROSTATECTOMY    . tm repair      History reviewed. No pertinent family history.  Social History:  reports that he has never smoked. He has never used smokeless tobacco. He reports current alcohol use of about 2.0 standard drinks of alcohol per week. No history on file for drug.  He is a retired Pharmacist, community.  He lives in [place].  He likes to golf.  He travels frequently.  The patient is alone today.  Allergies:  Allergies  Allergen Reactions  . Enalapril Maleate Other (See Comments)    Difficulty breathing  . Hydrocodone Other (See Comments)  .  Hydrocodone-Acetaminophen Nausea Only    Current Medications: Current Outpatient Medications  Medication Sig Dispense Refill  . amLODipine (NORVASC) 10 MG tablet TAKE 1 TABLET (5 MG TOTAL) BY MOUTH ONCE DAILY.    [name] aspirin EC 81 MG tablet Take by mouth.    [name] atorvastatin (LIPITOR) 80 MG tablet TAKE 1/2 TABLET BY MOUTH EVERY NIGHT  1  . celecoxib (CELEBREX) 100 MG capsule Take by mouth daily as needed. Reported on 07/15/2015    . Cholecalciferol (VITAMIN D-1000 MAX ST) 1000 UNITS tablet Take by mouth.    . Cyanocobalamin (B-12 COMPLIANCE INJECTION) 1000 MCG/ML KIT 1 mg [place] once daily for 7 days,then once a week x4 weeks, then once a month. 20 kit 2  . glimepiride (AMARYL) 1 MG tablet TAKE 1 TABLET (1 MG TOTAL) BY MOUTH DAILY WITH BREAKFAST.  3  . losartan (COZAAR) 100 MG tablet TAKE 1 TABLET (100 MG TOTAL) BY MOUTH ONCE DAILY.  1  . Blood Glucose Monitoring Suppl (ONE TOUCH ULTRA 2) W/DEVICE KIT USE AS DIRECTED. DX: E11.22, N18.2  0  . ONE TOUCH ULTRA TEST test strip USE AS DIRECTED. CHECK CBG'S FASTING ONCE DAILY. DX: E11.22, N18.2  11  . ONETOUCH DELICA LANCETS FINE MISC Use 1 Units as directed. Check CBG's fasting once daily. Dx: E11.22, N18.2     No current facility-administered medications for this visit.  Review of Systems:  GENERAL:  Feels "the same".  No fevers, sweats.  Weight up 4 pounds. PERFORMANCE STATUS (ECOG):  1 HEENT:  No visual changes, runny nose, sore throat, mouth sores or tenderness. Lungs:  Shortness of breath with exertion.  No cough.  No hemoptysis. Cardiac:  No chest pain, palpitations, orthopnea, or PND. GI:  No nausea, vomiting, diarrhea, constipation, melena or hematochezia. GU:  No urgency, frequency, dysuria, or hematuria. Musculoskeletal:  Chronic back and left hip pain.  No muscle tenderness. Extremities:  No pain or swelling. Skin:  No rashes or skin changes. Neuro:  No headache, numbness or weakness, balance or coordination issues. Endocrine:   Diabetes.  No thyroid issues, hot flashes or night sweats. Psych:  No mood changes, depression or anxiety. Pain:  No focal pain. Review of systems:  All other systems reviewed and found to be negative.   Physical Exam: Blood pressure 126/69, pulse (!) 59, temperature 97.6 F (36.4 C), temperature source Tympanic, resp. rate 18, height '5\' 6"'$  (1.676 m), weight 181 lb 14.1 oz (82.5 kg), SpO2 97 %. GENERAL:  Well developed, well nourished, gentleman sitting comfortably in the exam room in no acute distress. MENTAL STATUS:  Alert and oriented to person, place and time. HEAD:  Short gray hair.  Normocephalic, atraumatic, face symmetric, no Cushingoid features. EYES:  Glasses.  Blue eyes.  No conjunctivitis or scleral icterus. NEUROLOGICAL: Unremarkable. PSYCH:  Appropriate.    Appointment on 07/17/2018  Component Date Value Ref Range Status  . WBC 07/17/2018 4.7  4.0 - 10.5 K/uL Final  . RBC 07/17/2018 5.12  4.22 - 5.81 MIL/uL Final  . Hemoglobin 07/17/2018 16.5  13.0 - 17.0 g/dL Final  . HCT 07/17/2018 48.7  39.0 - 52.0 % Final  . MCV 07/17/2018 95.1  80.0 - 100.0 fL Final  . MCH 07/17/2018 32.2  26.0 - 34.0 pg Final  . MCHC 07/17/2018 33.9  30.0 - 36.0 g/dL Final  . RDW 07/17/2018 13.1  11.5 - 15.5 % Final  . Platelets 07/17/2018 143* 150 - 400 K/uL Final  . nRBC 07/17/2018 0.0  0.0 - 0.2 % Final  . Neutrophils Relative % 07/17/2018 66  % Final  . Neutro Abs 07/17/2018 3.1  1.7 - 7.7 K/uL Final  . Lymphocytes Relative 07/17/2018 19  % Final  . Lymphs Abs 07/17/2018 0.9  0.7 - 4.0 K/uL Final  . Monocytes Relative 07/17/2018 9  % Final  . Monocytes Absolute 07/17/2018 0.4  0.1 - 1.0 K/uL Final  . Eosinophils Relative 07/17/2018 5  % Final  . Eosinophils Absolute 07/17/2018 0.3  0.0 - 0.5 K/uL Final  . Basophils Relative 07/17/2018 1  % Final  . Basophils Absolute 07/17/2018 0.1  0.0 - 0.1 K/uL Final  . Immature Granulocytes 07/17/2018 0  % Final  . Abs Immature Granulocytes  07/17/2018 0.01  0.00 - 0.07 K/uL Final   Performed at [facility], [address], [place], [place] [address]    Assessment:  James Watson is a 78 y.o. male with mild thrombocytopenia dating back to at least 2013/2015 with no significant change over the years.  Peripheral smear on 04/06/2016 revealed mild thrombocytopenia with no abnormal or immature cells.  ANA, lupus anticoagulant, SPEP, hepatitis C antibody were negative.  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  Because of platelet count stability, he deferred bone marrow biopsy.  Platelet count has been followed:  126,000 on 09/11/2012, 139,000 on 04/06/2016, 141,000  on 04/20/2016, 154,000 on 08/18/2016, 143,000 on 04/18/2017, 135,000 on 05/03/2017, and 153,000 on 04/17/2018.  Work-up on 04/18/2017 revealed a hematocrit of 48.4, hemoglobin 16.4, MCV 95, platelets 143,000, WBC 3300 with an ANC of 2300.  ALC was 600.  Normal studies included:  LFTs, folate (16.7), B12 (473), TSH, H pylori IgG, and hepatitis B core antibody total.  Copper level was 94 (72-166) on 05/03/2017.    Work-up on 04/17/2018 revealed a hematocrit of 51.1, hemoglobin 17.1, MCV 94.5, platelets 153,000, WBC 4300 with an ANC of 2900.  Creatinine was 1.72 (CrCl 36 ml/min).  B12 was 227 (low).  Normal studies included:  LFTs, folate (11.6), SPEP, epo level (12.8), carbon monoxide level (2.6), testosterone (257), JAK2, exon 12-15, CALR, and MPL.  Kappa free light chains were 25.2, lambda free light chains 11.0 with a ratio of 2.29 (0.26 - 1.65).  24 hour UPEP revealed no monoclonal protein.    There was 406 mg protein/24 hours.  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  Ferritin, iron studies and BCR-ABL were normal on 05/08/2018.  He has a history of B12 deficiency.  B12 level was 248 (low normal) on 04/06/2016.  MMA was 428 (high) on 04/20/2016 and 462 (high) on 04/18/2017 poissibly due to renal insufficiency.  He took B12 IM for 9 months.   He went out of town and the injections stopped.   Anti-parietal antibody and intrinsic factor antibody were negative on 05/03/2018.  He began oral B12 on 05/08/2018.  B12 was 442 on 06/11/2018.  He has a history of adenomatous colonic polyps.  Last colonoscopy was in 07/2015.  He has a history of prostate cancer treated with prostatectomy.    He has chronic kidney disease.  Creatinine was 1.81 on 04/18/2017 and 1.72 on 04/07/2018.  Calcium, albumen, and protein were normal.  Myeloma panel on 04/06/2016 revealed no monoclonal protein and normal immunoglobulins. SPEP on 04/17/2018 revealed no monoclonal protein.  IFE urine on 04/19/2017 revealed 618 mg protein in 24 hours (30-150).  Free light chain excretion rate was 15.5 mg/L (normal), lambda free light chains 0.76 mg/L (normal) with a free kappa/lambda ratio 20.39 (2.04-10.37).  24 hour UPEP revealed no monoclonal protein.  There was 406 mg protein/24 hours.  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  He has mild polycythemia.  Hemoglobin was 16.6 on 05/03/2018.  He denies any cardiopulmonary disease, tobacco use, sleep apnea or testosterone use.  CXR on 05/08/2018 revealed no acute cardiopulmonary abnormalities.  Symptomatically, he has persistent shortness of breath with exertion.  Exam is unremarkable.  Hemoglobin is 16.5.  Platelet count is 143,000.  Plan: 1.   Labs today:  CBC with diff. 2.   Mild thrombocytopenia  Platelet count is 143,000.  Continue to monitor. 3.   B12 deficiency  B12 level was 227 (low) on 04/17/2018.  After initiation of oral B12   B12 was 442 on 06/11/2018.  Continue oral B12. 4.   Mild erythrocytosis  Patient has persistent shortness of breath with exertion.   Oxygen saturation 94% with desaturation to 91% with ambulation.   CXR (PA and lateral) on 05/08/2018 revealed no acute cardiopulmonary abnormalities.  Normal studies included:   Epo level, carbon monoxide level, testosterone, and JAK2 with  reflex.  Discuss follow-up with Dr Netty Starring and possible pulmonary medicine. 5.   RTC in 3 months for MD assessment, labs (CBC with diff, anti-parietal antibody, intrinsic factor antibody), +/- phlebotomy.  I discussed the assessment and treatment plan with the patient.  The patient was provided an opportunity to ask questions and all were answered.  The patient agreed with the plan and demonstrated an understanding of the instructions.  The patient was advised to call back or seek an in person evaluation if the symptoms worsen or if the condition fails to improve as anticipated.    Lequita Asal, MD  07/17/2018, 4:35 PM

## 2018-10-16 ENCOUNTER — Ambulatory Visit: Payer: Medicare Other | Admitting: Hematology and Oncology

## 2018-10-16 ENCOUNTER — Other Ambulatory Visit: Payer: Medicare Other

## 2018-10-18 ENCOUNTER — Other Ambulatory Visit: Payer: Self-pay

## 2018-10-19 ENCOUNTER — Inpatient Hospital Stay: Payer: Medicare Other | Attending: Hematology and Oncology

## 2018-10-19 ENCOUNTER — Other Ambulatory Visit: Payer: Self-pay

## 2018-10-19 DIAGNOSIS — Z85828 Personal history of other malignant neoplasm of skin: Secondary | ICD-10-CM | POA: Diagnosis not present

## 2018-10-19 DIAGNOSIS — D751 Secondary polycythemia: Secondary | ICD-10-CM | POA: Diagnosis present

## 2018-10-19 DIAGNOSIS — D696 Thrombocytopenia, unspecified: Secondary | ICD-10-CM | POA: Diagnosis not present

## 2018-10-19 DIAGNOSIS — M1990 Unspecified osteoarthritis, unspecified site: Secondary | ICD-10-CM | POA: Diagnosis not present

## 2018-10-19 DIAGNOSIS — N182 Chronic kidney disease, stage 2 (mild): Secondary | ICD-10-CM | POA: Insufficient documentation

## 2018-10-19 DIAGNOSIS — Z885 Allergy status to narcotic agent status: Secondary | ICD-10-CM | POA: Insufficient documentation

## 2018-10-19 DIAGNOSIS — Z7289 Other problems related to lifestyle: Secondary | ICD-10-CM | POA: Diagnosis not present

## 2018-10-19 DIAGNOSIS — Z86010 Personal history of colonic polyps: Secondary | ICD-10-CM | POA: Insufficient documentation

## 2018-10-19 DIAGNOSIS — E1122 Type 2 diabetes mellitus with diabetic chronic kidney disease: Secondary | ICD-10-CM | POA: Insufficient documentation

## 2018-10-19 DIAGNOSIS — Z8546 Personal history of malignant neoplasm of prostate: Secondary | ICD-10-CM | POA: Diagnosis not present

## 2018-10-19 DIAGNOSIS — Z79899 Other long term (current) drug therapy: Secondary | ICD-10-CM | POA: Insufficient documentation

## 2018-10-19 DIAGNOSIS — Z8673 Personal history of transient ischemic attack (TIA), and cerebral infarction without residual deficits: Secondary | ICD-10-CM | POA: Insufficient documentation

## 2018-10-19 DIAGNOSIS — E538 Deficiency of other specified B group vitamins: Secondary | ICD-10-CM | POA: Insufficient documentation

## 2018-10-19 LAB — CBC WITH DIFFERENTIAL/PLATELET
Abs Immature Granulocytes: 0.01 10*3/uL (ref 0.00–0.07)
Basophils Absolute: 0 10*3/uL (ref 0.0–0.1)
Basophils Relative: 1 %
Eosinophils Absolute: 0.2 10*3/uL (ref 0.0–0.5)
Eosinophils Relative: 5 %
HCT: 49.2 % (ref 39.0–52.0)
Hemoglobin: 16.9 g/dL (ref 13.0–17.0)
Immature Granulocytes: 0 %
Lymphocytes Relative: 18 %
Lymphs Abs: 0.7 10*3/uL (ref 0.7–4.0)
MCH: 32 pg (ref 26.0–34.0)
MCHC: 34.3 g/dL (ref 30.0–36.0)
MCV: 93.2 fL (ref 80.0–100.0)
Monocytes Absolute: 0.3 10*3/uL (ref 0.1–1.0)
Monocytes Relative: 8 %
Neutro Abs: 2.5 10*3/uL (ref 1.7–7.7)
Neutrophils Relative %: 68 %
Platelets: 166 10*3/uL (ref 150–400)
RBC: 5.28 MIL/uL (ref 4.22–5.81)
RDW: 12.7 % (ref 11.5–15.5)
WBC: 3.6 10*3/uL — ABNORMAL LOW (ref 4.0–10.5)
nRBC: 0 % (ref 0.0–0.2)

## 2018-10-22 ENCOUNTER — Inpatient Hospital Stay: Payer: Medicare Other | Admitting: Hematology and Oncology

## 2018-10-22 ENCOUNTER — Other Ambulatory Visit: Payer: Medicare Other

## 2018-10-22 ENCOUNTER — Inpatient Hospital Stay: Payer: Medicare Other

## 2018-10-22 LAB — ANTI-PARIETAL ANTIBODY: Parietal Cell Antibody-IgG: 1.2 Units (ref 0.0–20.0)

## 2018-10-22 LAB — INTRINSIC FACTOR ANTIBODIES: Intrinsic Factor: 1.1 AU/mL (ref 0.0–1.1)

## 2018-10-22 NOTE — Progress Notes (Signed)
[facility]  [address] [place], [place] [address] Phone: [phone]  Fax: [phone]   Clinic Day:  10/23/2018  Referring physician: Dion Body, MD  Chief Complaint: James Watson is a 78 y.o. male with a history of chronic thrombocytopenia, renal insufficiency, and mild erythrocytosis who is seen for 3 month assessment.  HPI: The patient was last seen in the hematology clinic on 07/17/2018. At that time, he had persistent shortness of breath with exertion.  Exam was unremarkable. Hemoglobin was 16.5. Platelet count was 143,000.  CBCs followed: 09/21/2018: WBC 3,500, hemoglobin 16.8, hematocrit 49.5, MCV 94.5, platelets 157,000.  10/19/2018: WBC 3,600 (ANC 2,500), hemoglobin 16.9, hematocrit 49.2, MCV 93.2, platelets 166,000.  Intrinsic factor antibodies and anti-parietal antibodies were negatve.  During the interim, he is doing "good."  He denies shortness of breath. His arthritis is improved.  He can bend his knees more and has less pain.  His dose of hydroxychloroquine was dropped to 1x daily from 2x daily.    Past Medical History:  Diagnosis Date  . Adenomatous colon polyp 06/18/2015  . Basal cell carcinoma   . BCC (basal cell carcinoma)    followed by Dr. Evorn Gong   . Chronic renal insufficiency   . Diabetes mellitus without complication ([place])   . Hypercholesteremia   . Hypertension   . Osteoarthritis   . Prostate cancer ([place])   . Squamous cell carcinoma   . TIA (transient ischemic attack)     Past Surgical History:  Procedure Laterality Date  . COLONOSCOPY WITH PROPOFOL N/A 07/16/2015   Procedure: COLONOSCOPY WITH PROPOFOL;  Surgeon: Manya Silvas, MD;  Location: [facility] ENDOSCOPY;  Service: Endoscopy;  Laterality: N/A;  . left shoulder arthroscopy    . mtp repair    . PROSTATECTOMY    . tm repair      History reviewed. No pertinent family history.  Social History:  reports that he has never smoked. He has  never used smokeless tobacco. He reports current alcohol use of about 2.0 standard drinks of alcohol per week. No history on file for drug. 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 He lives in [place].  He likes to golf.  He travels frequently.  The patient is alone today.  Allergies:  Allergies  Allergen Reactions  . Enalapril Maleate Other (See Comments)    Difficulty breathing  . Hydrocodone Other (See Comments)  . Hydrocodone-Acetaminophen Nausea Only    Current Medications: Current Outpatient Medications  Medication Sig Dispense Refill  . amLODipine (NORVASC) 10 MG tablet Take 10 mg by mouth daily.     [name] aspirin EC 81 MG tablet Take 81 mg by mouth daily.     [name] atorvastatin (LIPITOR) 80 MG tablet TAKE 1/2 TABLET BY MOUTH EVERY NIGHT  1  . Blood Glucose Monitoring Suppl (ONE TOUCH ULTRA 2) W/DEVICE KIT USE AS DIRECTED. DX: E11.22, N18.2  0  . Cholecalciferol (VITAMIN D-1000 MAX ST) 1000 UNITS tablet Take 2,000 Units by mouth daily.     . Coenzyme Q10 (CO Q-10) 100 MG CAPS Take 1 capsule by mouth daily.     [name] glimepiride (AMARYL) 1 MG tablet TAKE 1 TABLET (1 MG TOTAL) BY MOUTH DAILY WITH BREAKFAST.  3  . hydrochlorothiazide (HYDRODIURIL) 12.5 MG tablet Take 12.5 mg by mouth daily.    . hydroxychloroquine (PLAQUENIL) 200 MG tablet Take 200 mg by mouth daily.     [name] losartan (COZAAR) 100 MG tablet  TAKE 1 TABLET (100 MG TOTAL) BY MOUTH ONCE DAILY.  1  . ONE TOUCH ULTRA TEST test strip USE AS DIRECTED. CHECK CBG'S FASTING ONCE DAILY. DX: E11.22, N18.2  11  . ONETOUCH DELICA LANCETS FINE MISC Use 1 Units as directed. Check CBG's fasting once daily. Dx: E11.22, N18.2    . Turmeric 400 MG CAPS Take 1 capsule by mouth 2 (two) times a day.     . vitamin B-12 (CYANOCOBALAMIN) 1000 MCG tablet Take 1,000 mcg by mouth daily.     No current facility-administered medications for this visit.     Review of Systems  Constitutional: Negative for chills, diaphoresis, fever,  malaise/fatigue and weight loss (up 6 pounds).       Feels "good".  HENT: Negative.  Negative for congestion, ear pain, hearing loss, nosebleeds, sinus pain and sore throat.   Eyes: Negative.  Negative for blurred vision, double vision, photophobia and pain.  Respiratory: Negative.  Negative for cough, sputum production, shortness of breath and wheezing.   Cardiovascular: Negative.  Negative for chest pain, palpitations, leg swelling and PND.  Gastrointestinal: Negative.  Negative for abdominal pain, blood in stool, constipation, diarrhea, heartburn, melena, nausea and vomiting.  Genitourinary: Negative.  Negative for dysuria, frequency, hematuria and urgency.  Musculoskeletal: Positive for back pain (chronic) and joint pain (chronic arthritis in left hip, knees). Negative for myalgias.  Skin: Negative.  Negative for itching and rash.  Neurological: Negative for dizziness, sensory change, speech change, focal weakness, weakness and headaches.  Endo/Heme/Allergies: Does not bruise/bleed easily.       Diabetes.  Psychiatric/Behavioral: Negative.  Negative for depression and memory loss. The patient is not nervous/anxious and does not have insomnia.   All other systems reviewed and are negative.  Performance Status (ECOG): 1  Physical Exam  Constitutional: He is oriented to person, place, and time. He appears well-developed and well-nourished. No distress.  HENT:  Head: Normocephalic and atraumatic.  Mouth/Throat: Oropharynx is clear and moist. No oropharyngeal exudate.  Gray hair.  Male pattern baldness.   Eyes: Pupils are equal, round, and reactive to light. Conjunctivae and EOM are normal. No scleral icterus.  Glasses.  Neck: Normal range of motion. Neck supple. No JVD present.  Cardiovascular: Normal rate, regular rhythm and normal heart sounds. Exam reveals no gallop.  No murmur heard. Pulmonary/Chest: Effort normal and breath sounds normal. No respiratory distress. He has no wheezes.  He has no rales.  Abdominal: Soft. Bowel sounds are normal. He exhibits no distension and no mass. There is no abdominal tenderness. There is no rebound and no guarding.  Musculoskeletal: Normal range of motion.        General: No edema.  Lymphadenopathy:    He has no cervical adenopathy.    He has no axillary adenopathy.       Right: No supraclavicular adenopathy present.       Left: No supraclavicular adenopathy present.  Neurological: He is alert and oriented to person, place, and time.  Skin: Skin is warm and dry. No rash noted. He is not diaphoretic. No erythema. No pallor.  Psychiatric: He has a normal mood and affect. His behavior is normal. Judgment and thought content normal.  Nursing note and vitals reviewed.   No visits with results within 3 Day(s) from this visit.  Latest known visit with results is:  Orders Only on 10/19/2018  Component Date Value Ref Range Status  . WBC 10/19/2018 3.6* 4.0 - 10.5 K/uL Final  . RBC 10/19/2018  5.28  4.22 - 5.81 MIL/uL Final  . Hemoglobin 10/19/2018 16.9  13.0 - 17.0 g/dL Final  . HCT 10/19/2018 49.2  39.0 - 52.0 % Final  . MCV 10/19/2018 93.2  80.0 - 100.0 fL Final  . MCH 10/19/2018 32.0  26.0 - 34.0 pg Final  . MCHC 10/19/2018 34.3  30.0 - 36.0 g/dL Final  . RDW 10/19/2018 12.7  11.5 - 15.5 % Final  . Platelets 10/19/2018 166  150 - 400 K/uL Final  . nRBC 10/19/2018 0.0  0.0 - 0.2 % Final  . Neutrophils Relative % 10/19/2018 68  % Final  . Neutro Abs 10/19/2018 2.5  1.7 - 7.7 K/uL Final  . Lymphocytes Relative 10/19/2018 18  % Final  . Lymphs Abs 10/19/2018 0.7  0.7 - 4.0 K/uL Final  . Monocytes Relative 10/19/2018 8  % Final  . Monocytes Absolute 10/19/2018 0.3  0.1 - 1.0 K/uL Final  . Eosinophils Relative 10/19/2018 5  % Final  . Eosinophils Absolute 10/19/2018 0.2  0.0 - 0.5 K/uL Final  . Basophils Relative 10/19/2018 1  % Final  . Basophils Absolute 10/19/2018 0.0  0.0 - 0.1 K/uL Final  . Immature Granulocytes 10/19/2018 0  %  Final  . Abs Immature Granulocytes 10/19/2018 0.01  0.00 - 0.07 K/uL Final   Performed at [facility], [address], [place], [place] [address]  . Intrinsic Factor 10/19/2018 1.1  0.0 - 1.1 AU/mL Final   Comment: (NOTE) Performed At: [facility] [place], [place] [id number] Rush Farmer MD [id number]   . Parietal Cell Antibody-IgG 10/19/2018 1.2  0.0 - 20.0 Units Final   Comment: (NOTE)                                Negative    0.0 - 20.0                                Equivocal  20.1 - 24.9                                Positive         >24.9 Parietal Cell Antibodies are found in 90% of patients with pernicious anemia and 30% of first degree relatives with pernicious anemia. Performed At: [facility] [place], [place] [id number] Rush Farmer MD [id number]     Assessment:  James Watson is a 78 y.o. male with mild thrombocytopenia dating back to at least 2013/2015 with no significant change over the years.  Peripheral smear on 04/06/2016 revealed mild thrombocytopenia with no abnormal or immature cells.  ANA, lupus anticoagulant, SPEP, hepatitis C antibody were negative.  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  Because of platelet count stability, he deferred bone marrow biopsy.  Platelet count has been followed:  126,000 on 09/11/2012, 139,000 on 04/06/2016, 141,000 on 04/20/2016, 154,000 on 08/18/2016, 143,000 on 04/18/2017, 135,000 on 05/03/2017, 153,000 on 04/17/2018, 165,000 on 05/08/2018, 154,000 on 06/11/2018, 143,000 on 07/17/2018, 157,000 on 09/21/2018, and 166,000 on 10/19/2018.  Work-up on 04/18/2017 revealed a hematocrit of 48.4, hemoglobin 16.4, MCV 95, platelets 143,000, WBC 3300 with an ANC of 2300.  ALC was 600.  Normal studies included:  LFTs, folate (16.7), B12 (473), TSH, H pylori IgG, and hepatitis  B core antibody total.  Copper level was 94 (72-166) on 05/03/2017.     Work-up on 04/17/2018 revealed a hematocrit of 51.1, hemoglobin 17.1, MCV 94.5, platelets 153,000, WBC 4300 with an ANC of 2900.  Creatinine was 1.72 (CrCl 36 ml/min).  B12 was 227 (low).  Normal studies included:  LFTs, folate (11.6), SPEP, epo level (12.8), carbon monoxide level (2.6), testosterone (257), JAK2, exon 12-15, CALR, and MPL.  Kappa free light chains were 25.2, lambda free light chains 11.0 with a ratio of 2.29 (0.26 - 1.65).  24 hour UPEP revealed no monoclonal protein.    There was 406 mg protein/24 hours.  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  Ferritin, iron studies and BCR-ABL were normal on 05/08/2018.  He has a history of B12 deficiency.  B12 level was 248 (low normal) on 04/06/2016.  MMA was 428 (high) on 04/20/2016 and 462 (high) on 04/18/2017 poissibly due to renal insufficiency.  He took B12 IM for 9 months.  He went out of town and the injections stopped.   Anti-parietal antibody and intrinsic factor antibody were negative on 05/03/2018.  He began oral B12 on 05/08/2018.  B12 was 442 on 06/11/2018.  He has a history of adenomatous colonic polyps.  Last colonoscopy was in 07/2015.  He has a history of prostate cancer treated with prostatectomy.    He has chronic kidney disease.  Creatinine was 1.81 on 04/18/2017 and 1.72 on 04/07/2018.  Calcium, albumen, and protein were normal.  Myeloma panel on 04/06/2016 revealed no monoclonal protein and normal immunoglobulins. SPEP on 04/17/2018 revealed no monoclonal protein.  IFE urine on 04/19/2017 revealed 618 mg protein in 24 hours (30-150).  Free light chain excretion rate was 15.5 mg/L (normal), lambda free light chains 0.76 mg/L (normal) with a free kappa/lambda ratio 20.39 (2.04-10.37).  24 hour UPEP revealed no monoclonal protein.  There was 406 mg protein/24 hours.  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  He has mild polycythemia.  Hemoglobin was 16.6 on 05/03/2018.  He denies any cardiopulmonary disease, tobacco use, sleep  apnea or testosterone use.  CXR on 05/08/2018 revealed no acute cardiopulmonary abnormalities.  Symptomatically, he is doing well.  Exam is unremarkable.  Plan: 1.   Labs today:  Hemoglobin. 2.   Mild thrombocytopenia             Platelet count is 166,000 (normal).  Temporally related to decreasing his hydroxychloroquine.  Continue to monitor counts. 3.   B12 deficiency             B12 level was 227 (low) on 04/17/2018.             B12 was 442 on 06/11/2018 after starting oral B12.             Continue oral B12.  No evidence of pernicious anemia (intrinsic factor and antiparietal cell antibodies were negative). 4.   Mild erythrocytosis             Hemoglobin 16.9.    Patient has shortness of breath with exertion.                         Oxygen saturation 94% with desaturation to 91% with ambulation.                         CXR (PA and lateral) on 05/08/2018 revealed no acute cardiopulmonary abnormalities.  Normal studies included:                         Epo level, carbon monoxide level, testosterone, and JAK2 with reflex.             Discuss small volume phlebotomy (300 cc) to maintain hemoglobin < 16.5. 5.   RTC in 3 months for labs (CBC with diff) +/- phlebotomy 6.   RTC in 6 months for MD assessment , labs (CBC with diff, ferritin), and +/- phlebotomy.   I discussed the assessment and treatment plan with the patient.  The patient was provided an opportunity to ask questions and all were answered.  The patient agreed with the plan and demonstrated an understanding of the instructions.  The patient was advised to call back if the symptoms worsen or if the condition fails to improve as anticipated.  I provided 15 minutes of face-to-face time during this this encounter and > 50% was spent counseling as documented under my assessment and plan.    Lequita Asal, MD, PhD    10/23/2018, 9:51 AM  I, Molly Dorshimer, am acting as Education administrator for [employer]. Mike Gip, MD, PhD.   I, Melissa C. Mike Gip, MD, have reviewed the above documentation for accuracy and completeness, and I agree with the above.

## 2018-10-23 ENCOUNTER — Inpatient Hospital Stay: Payer: Medicare Other

## 2018-10-23 ENCOUNTER — Encounter: Payer: Self-pay | Admitting: Hematology and Oncology

## 2018-10-23 ENCOUNTER — Inpatient Hospital Stay (HOSPITAL_BASED_OUTPATIENT_CLINIC_OR_DEPARTMENT_OTHER): Payer: Medicare Other | Admitting: Hematology and Oncology

## 2018-10-23 ENCOUNTER — Other Ambulatory Visit: Payer: Self-pay

## 2018-10-23 VITALS — BP 117/77 | HR 63 | Temp 97.5°F | Resp 16 | Wt 175.4 lb

## 2018-10-23 VITALS — BP 134/76 | HR 67

## 2018-10-23 DIAGNOSIS — E538 Deficiency of other specified B group vitamins: Secondary | ICD-10-CM | POA: Diagnosis not present

## 2018-10-23 DIAGNOSIS — Z85828 Personal history of other malignant neoplasm of skin: Secondary | ICD-10-CM

## 2018-10-23 DIAGNOSIS — Z8673 Personal history of transient ischemic attack (TIA), and cerebral infarction without residual deficits: Secondary | ICD-10-CM

## 2018-10-23 DIAGNOSIS — Z7289 Other problems related to lifestyle: Secondary | ICD-10-CM

## 2018-10-23 DIAGNOSIS — E1122 Type 2 diabetes mellitus with diabetic chronic kidney disease: Secondary | ICD-10-CM

## 2018-10-23 DIAGNOSIS — N182 Chronic kidney disease, stage 2 (mild): Secondary | ICD-10-CM | POA: Diagnosis not present

## 2018-10-23 DIAGNOSIS — Z86010 Personal history of colonic polyps: Secondary | ICD-10-CM

## 2018-10-23 DIAGNOSIS — D751 Secondary polycythemia: Secondary | ICD-10-CM

## 2018-10-23 DIAGNOSIS — Z79899 Other long term (current) drug therapy: Secondary | ICD-10-CM

## 2018-10-23 DIAGNOSIS — D696 Thrombocytopenia, unspecified: Secondary | ICD-10-CM

## 2018-10-23 DIAGNOSIS — M1990 Unspecified osteoarthritis, unspecified site: Secondary | ICD-10-CM

## 2018-10-23 DIAGNOSIS — Z885 Allergy status to narcotic agent status: Secondary | ICD-10-CM

## 2018-10-23 DIAGNOSIS — Z8546 Personal history of malignant neoplasm of prostate: Secondary | ICD-10-CM

## 2018-10-23 LAB — HEMOGLOBIN: Hemoglobin: 16.8 g/dL (ref 13.0–17.0)

## 2018-10-23 NOTE — Patient Instructions (Signed)

## 2018-10-23 NOTE — Progress Notes (Signed)
Pt here for follow up. Denies any concerns at this time.  

## 2019-01-18 ENCOUNTER — Other Ambulatory Visit: Payer: Self-pay

## 2019-01-21 ENCOUNTER — Ambulatory Visit: Payer: Medicare Other

## 2019-01-21 ENCOUNTER — Inpatient Hospital Stay: Payer: Medicare Other | Attending: Hematology and Oncology

## 2019-01-21 ENCOUNTER — Other Ambulatory Visit: Payer: Self-pay

## 2019-01-21 VITALS — BP 130/77 | HR 70 | Temp 97.0°F | Resp 20

## 2019-01-21 DIAGNOSIS — D696 Thrombocytopenia, unspecified: Secondary | ICD-10-CM

## 2019-01-21 DIAGNOSIS — E538 Deficiency of other specified B group vitamins: Secondary | ICD-10-CM

## 2019-01-21 DIAGNOSIS — D751 Secondary polycythemia: Secondary | ICD-10-CM | POA: Diagnosis not present

## 2019-01-21 LAB — CBC WITH DIFFERENTIAL/PLATELET
Abs Immature Granulocytes: 0.02 10*3/uL (ref 0.00–0.07)
Basophils Absolute: 0.1 10*3/uL (ref 0.0–0.1)
Basophils Relative: 1 %
Eosinophils Absolute: 0.2 10*3/uL (ref 0.0–0.5)
Eosinophils Relative: 3 %
HCT: 48.9 % (ref 39.0–52.0)
Hemoglobin: 16.8 g/dL (ref 13.0–17.0)
Immature Granulocytes: 0 %
Lymphocytes Relative: 16 %
Lymphs Abs: 1 10*3/uL (ref 0.7–4.0)
MCH: 32.5 pg (ref 26.0–34.0)
MCHC: 34.4 g/dL (ref 30.0–36.0)
MCV: 94.6 fL (ref 80.0–100.0)
Monocytes Absolute: 0.5 10*3/uL (ref 0.1–1.0)
Monocytes Relative: 8 %
Neutro Abs: 4.4 10*3/uL (ref 1.7–7.7)
Neutrophils Relative %: 72 %
Platelets: 155 10*3/uL (ref 150–400)
RBC: 5.17 MIL/uL (ref 4.22–5.81)
RDW: 12.3 % (ref 11.5–15.5)
WBC: 6.2 10*3/uL (ref 4.0–10.5)
nRBC: 0 % (ref 0.0–0.2)

## 2019-01-21 NOTE — Patient Instructions (Signed)

## 2019-01-22 ENCOUNTER — Other Ambulatory Visit: Payer: Medicare Other

## 2019-01-23 ENCOUNTER — Other Ambulatory Visit: Payer: Self-pay

## 2019-01-23 ENCOUNTER — Encounter: Payer: Self-pay | Admitting: *Deleted

## 2019-01-25 ENCOUNTER — Other Ambulatory Visit: Payer: Self-pay

## 2019-01-25 ENCOUNTER — Other Ambulatory Visit
Admission: RE | Admit: 2019-01-25 | Discharge: 2019-01-25 | Disposition: A | Discharge status: Home / Self Care | Payer: Medicare Other | Source: Ambulatory Visit | Attending: Ophthalmology | Admitting: Ophthalmology

## 2019-01-25 DIAGNOSIS — Z01812 Encounter for preprocedural laboratory examination: Secondary | ICD-10-CM | POA: Diagnosis not present

## 2019-01-25 DIAGNOSIS — Z20828 Contact with and (suspected) exposure to other viral communicable diseases: Secondary | ICD-10-CM | POA: Diagnosis not present

## 2019-01-25 LAB — SARS CORONAVIRUS 2 (TAT 6-24 HRS): SARS Coronavirus 2: NEGATIVE

## 2019-01-25 NOTE — Discharge Instructions (Signed)

## 2019-01-30 ENCOUNTER — Ambulatory Visit: Payer: Medicare Other | Admitting: Anesthesiology

## 2019-01-30 ENCOUNTER — Ambulatory Visit
Admission: RE | Admit: 2019-01-30 | Discharge: 2019-01-30 | Disposition: A | Discharge status: Home / Self Care | Payer: Medicare Other | Attending: Ophthalmology | Admitting: Ophthalmology

## 2019-01-30 ENCOUNTER — Encounter
Admission: RE | Disposition: A | Discharge status: Home / Self Care | Payer: Self-pay | Source: Home / Self Care | Attending: Ophthalmology

## 2019-01-30 ENCOUNTER — Other Ambulatory Visit: Payer: Self-pay

## 2019-01-30 DIAGNOSIS — Z8546 Personal history of malignant neoplasm of prostate: Secondary | ICD-10-CM | POA: Insufficient documentation

## 2019-01-30 DIAGNOSIS — I129 Hypertensive chronic kidney disease with stage 1 through stage 4 chronic kidney disease, or unspecified chronic kidney disease: Secondary | ICD-10-CM | POA: Insufficient documentation

## 2019-01-30 DIAGNOSIS — H2512 Age-related nuclear cataract, left eye: Secondary | ICD-10-CM | POA: Insufficient documentation

## 2019-01-30 DIAGNOSIS — Z7984 Long term (current) use of oral hypoglycemic drugs: Secondary | ICD-10-CM | POA: Insufficient documentation

## 2019-01-30 DIAGNOSIS — Z7982 Long term (current) use of aspirin: Secondary | ICD-10-CM | POA: Insufficient documentation

## 2019-01-30 DIAGNOSIS — Z885 Allergy status to narcotic agent status: Secondary | ICD-10-CM | POA: Insufficient documentation

## 2019-01-30 DIAGNOSIS — N189 Chronic kidney disease, unspecified: Secondary | ICD-10-CM | POA: Diagnosis not present

## 2019-01-30 DIAGNOSIS — E7800 Pure hypercholesterolemia, unspecified: Secondary | ICD-10-CM | POA: Insufficient documentation

## 2019-01-30 DIAGNOSIS — E1122 Type 2 diabetes mellitus with diabetic chronic kidney disease: Secondary | ICD-10-CM | POA: Diagnosis not present

## 2019-01-30 DIAGNOSIS — M1990 Unspecified osteoarthritis, unspecified site: Secondary | ICD-10-CM | POA: Diagnosis not present

## 2019-01-30 DIAGNOSIS — Z8673 Personal history of transient ischemic attack (TIA), and cerebral infarction without residual deficits: Secondary | ICD-10-CM | POA: Diagnosis not present

## 2019-01-30 DIAGNOSIS — Z79899 Other long term (current) drug therapy: Secondary | ICD-10-CM | POA: Diagnosis not present

## 2019-01-30 HISTORY — PX: CATARACT EXTRACTION W/PHACO: SHX586

## 2019-01-30 LAB — GLUCOSE, CAPILLARY: Glucose-Capillary: 115 mg/dL — ABNORMAL HIGH (ref 70–99)

## 2019-01-30 SURGERY — PHACOEMULSIFICATION, CATARACT, WITH IOL INSERTION
Anesthesia: Monitor Anesthesia Care | Site: Eye | Laterality: Left

## 2019-01-30 MED ORDER — FENTANYL CITRATE (PF) 100 MCG/2ML IJ SOLN
INTRAMUSCULAR | Status: DC | PRN
  Administered 2019-01-30: 50 ug via INTRAVENOUS

## 2019-01-30 MED ORDER — TETRACAINE HCL 0.5 % OP SOLN
1.0000 [drp] | OPHTHALMIC | Status: DC | PRN
  Administered 2019-01-30 (×3): 1 [drp] via OPHTHALMIC

## 2019-01-30 MED ORDER — ARMC OPHTHALMIC DILATING DROPS
1.0000 "application " | OPHTHALMIC | Status: DC | PRN
  Administered 2019-01-30 (×3): 1 via OPHTHALMIC

## 2019-01-30 MED ORDER — MOXIFLOXACIN HCL 0.5 % OP SOLN
1.0000 [drp] | OPHTHALMIC | Status: DC | PRN
  Administered 2019-01-30 (×3): 1 [drp] via OPHTHALMIC

## 2019-01-30 MED ORDER — LIDOCAINE HCL (PF) 2 % IJ SOLN
INTRAOCULAR | Status: DC | PRN
  Administered 2019-01-30: 1 mL

## 2019-01-30 MED ORDER — NA HYALUR & NA CHOND-NA HYALUR 0.4-0.35 ML IO KIT
PACK | INTRAOCULAR | Status: DC | PRN
  Administered 2019-01-30: 1 mL via INTRAOCULAR

## 2019-01-30 MED ORDER — BRIMONIDINE TARTRATE-TIMOLOL 0.2-0.5 % OP SOLN
OPHTHALMIC | Status: DC | PRN
  Administered 2019-01-30: 1 [drp] via OPHTHALMIC

## 2019-01-30 MED ORDER — ACETAMINOPHEN 160 MG/5ML PO SOLN: 325.0000 mg | ORAL | Status: DC | PRN

## 2019-01-30 MED ORDER — ACETAMINOPHEN 325 MG PO TABS: 325.0000 mg | ORAL_TABLET | ORAL | Status: DC | PRN

## 2019-01-30 MED ORDER — ERYTHROMYCIN 5 MG/GM OP OINT
TOPICAL_OINTMENT | OPHTHALMIC | Status: DC | PRN
  Administered 2019-01-30: 1 via OPHTHALMIC

## 2019-01-30 MED ORDER — MIDAZOLAM HCL 2 MG/2ML IJ SOLN
INTRAMUSCULAR | Status: DC | PRN
  Administered 2019-01-30: 2 mg via INTRAVENOUS

## 2019-01-30 MED ORDER — CEFUROXIME OPHTHALMIC INJECTION 1 MG/0.1 ML
INJECTION | OPHTHALMIC | Status: DC | PRN
  Administered 2019-01-30: 0.1 mL via INTRACAMERAL

## 2019-01-30 MED ORDER — EPINEPHRINE PF 1 MG/ML IJ SOLN
INTRAOCULAR | Status: DC | PRN
  Administered 2019-01-30: 76 mL via OPHTHALMIC

## 2019-01-30 SURGICAL SUPPLY — 18 items
CANNULA ANT/CHMB 27G (MISCELLANEOUS) ×1 IMPLANT
CANNULA ANT/CHMB 27GA (MISCELLANEOUS) ×3 IMPLANT
GLOVE SURG LX 7.5 STRW (GLOVE) ×2
GLOVE SURG LX STRL 7.5 STRW (GLOVE) ×1 IMPLANT
GLOVE SURG TRIUMPH 8.0 PF LTX (GLOVE) ×3 IMPLANT
GOWN STRL REUS W/ TWL LRG LVL3 (GOWN DISPOSABLE) ×2 IMPLANT
GOWN STRL REUS W/TWL LRG LVL3 (GOWN DISPOSABLE) ×4
LENS IOL IQ PAN TRC 30 20.5 IMPLANT
LENS IOL PANOP TORIC 30 20.5 ×1 IMPLANT
LENS IOL PANOPTIX TORIC 20.5 ×2 IMPLANT
MARKER SKIN DUAL TIP RULER LAB (MISCELLANEOUS) ×3 IMPLANT
PACK CATARACT BRASINGTON (MISCELLANEOUS) ×3 IMPLANT
PACK EYE AFTER SURG (MISCELLANEOUS) ×3 IMPLANT
PACK OPTHALMIC (MISCELLANEOUS) ×3 IMPLANT
SYR 3ML LL SCALE MARK (SYRINGE) ×3 IMPLANT
SYR TB 1ML LUER SLIP (SYRINGE) ×3 IMPLANT
WATER STERILE IRR 500ML POUR (IV SOLUTION) ×3 IMPLANT
WIPE NON LINTING 3.25X3.25 (MISCELLANEOUS) ×3 IMPLANT

## 2019-01-30 NOTE — Anesthesia Preprocedure Evaluation (Signed)
Anesthesia Evaluation   Patient awake    Reviewed: Allergy & Precautions, NPO status , Patient's Chart, lab work & pertinent test results  Airway Mallampati: II  TM Distance: >3 FB Neck ROM: Limited    Dental   Pulmonary    Pulmonary exam normal        Cardiovascular hypertension, Normal cardiovascular exam     Neuro/Psych TIA   GI/Hepatic   Endo/Other  diabetes  Renal/GU CRFRenal disease     Musculoskeletal  (+) Arthritis ,   Abdominal   Peds  Hematology   Anesthesia Other Findings   Reproductive/Obstetrics                             Anesthesia Physical Anesthesia Plan  ASA: II  Anesthesia Plan: MAC   Post-op Pain Management:    Induction: Intravenous  PONV Risk Score and Plan:   Airway Management Planned: Nasal Cannula  Additional Equipment:   Intra-op Plan:   Post-operative Plan:   Informed Consent: I have reviewed the patients History and Physical, chart, labs and discussed the procedure including the risks, benefits and alternatives for the proposed anesthesia with the patient or authorized representative who has indicated his/her understanding and acceptance.       Plan Discussed with: CRNA, Anesthesiologist and Surgeon  Anesthesia Plan Comments:         Anesthesia Quick Evaluation

## 2019-01-30 NOTE — Anesthesia Postprocedure Evaluation (Signed)
Anesthesia Post Note  Patient: James Watson  Procedure(s) Performed: CATARACT EXTRACTION PHACO AND INTRAOCULAR LENS PLACEMENT (IOC) left diabetic  panoptix lens  00:59.4  15.3  9.09 (Left Eye)  Anesthesia Type: MAC    Wanda Plump Whitney Hillegass

## 2019-01-30 NOTE — Op Note (Signed)
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   PROCEDURE:  Phacoemulsification with Toric posterior chamber intraocular lens placement of the left eye.  LENS:  Implant Name Type Inv. Item Serial No. Manufacturer Lot No. LRB No. Used Action  ACRYSOF IQ PANOPTIX TORIC IOL Intraocular Lens  [id number]   Left 1 Implanted   [id number] 20.5 D Toric intraocular lens with 1.5 diopters of cylindrical power with axis orientation at 142 degrees.   ULTRASOUND TIME: 15% of 0 minutes, 59 seconds.  CDE 9.1   SURGEON:  Wyonia Hough, MD   ANESTHESIA:  Topical with tetracaine drops and 2% Xylocaine jelly, augmented with 1% preservative-free intracameral lidocaine.  COMPLICATIONS:  None.   DESCRIPTION OF PROCEDURE:  The patient was identified in the holding room and transported to the operating suite and placed in the supine position under the operating microscope.  The left eye was identified as the operative eye, and it was prepped and draped in the usual sterile ophthalmic fashion.    A clear-corneal paracentesis incision was made at the 1:30 position.  0.5 ml of preservative-free 1% lidocaine was injected into the anterior chamber. The anterior chamber was filled with Viscoat.  A 2.4 millimeter near clear corneal incision was then made at the 10:30 position.  A cystotome and capsulorrhexis forceps were then used to make a curvilinear capsulorrhexis.  Hydrodissection and hydrodelineation were then performed using balanced salt solution.   Phacoemulsification was then used in stop and chop fashion to remove the lens, nucleus and epinucleus.  The remaining cortex was aspirated using the irrigation and aspiration handpiece.  Provisc viscoelastic was then placed into the capsular bag to distend it for lens placement.  The Verion digital marker was used to align the  implant at the intended axis.   A 20.5 diopter lens was then injected into the capsular bag.  It was rotated clockwise until the axis marks on the lens were approximately 15 degrees in the counterclockwise direction to the intended alignment.  The viscoelastic was aspirated from the eye using the irrigation aspiration handpiece.  Then, a Koch spatula through the sideport incision was used to rotate the lens in a clockwise direction until the axis markings of the intraocular lens were lined up with the Verion alignment.  Balanced salt solution was then used to hydrate the wounds. Cefuroxime 0.1 ml of a 10mg /ml solution was injected into the anterior chamber for a dose of 1 mg of intracameral antibiotic at the completion of the case.    The eye was noted to have a physiologic pressure and there was no wound leak noted.   Timolol and Brimonidine drops were applied to the eye.  The patient was taken to the recovery room in stable condition having had no complications of anesthesia or surgery.  Maryanne Huneycutt 01/30/2019, 11:59 AM

## 2019-01-30 NOTE — Transfer of Care (Signed)
Immediate Anesthesia Transfer of Care Note  Patient: James Watson  Procedure(s) Performed: CATARACT EXTRACTION PHACO AND INTRAOCULAR LENS PLACEMENT (IOC) left diabetic  panoptix lens  00:59.4  15.3  9.09 (Left Eye)  Patient Location: PACU  Anesthesia Type: MAC  Level of Consciousness: awake, alert  and patient cooperative  Airway and Oxygen Therapy: Patient Spontanous Breathing and Patient connected to supplemental oxygen  Post-op Assessment: Post-op Vital signs reviewed, Patient's Cardiovascular Status Stable, Respiratory Function Stable, Patent Airway and No signs of Nausea or vomiting  Post-op Vital Signs: Reviewed and stable  Complications: No apparent anesthesia complications

## 2019-01-30 NOTE — H&P (Signed)

## 2019-01-30 NOTE — Anesthesia Procedure Notes (Signed)
Procedure Name: MAC Performed by: Khaleelah Yowell, CRNA Pre-anesthesia Checklist: Patient identified, Emergency Drugs available, Suction available, Timeout performed and Patient being monitored Patient Re-evaluated:Patient Re-evaluated prior to induction Oxygen Delivery Method: Nasal cannula Placement Confirmation: positive ETCO2       

## 2019-01-31 ENCOUNTER — Encounter: Payer: Self-pay | Admitting: Ophthalmology

## 2019-02-15 ENCOUNTER — Other Ambulatory Visit: Payer: Self-pay

## 2019-02-15 ENCOUNTER — Other Ambulatory Visit
Admission: RE | Admit: 2019-02-15 | Discharge: 2019-02-15 | Disposition: A | Discharge status: Home / Self Care | Payer: Medicare Other | Source: Ambulatory Visit | Attending: Ophthalmology | Admitting: Ophthalmology

## 2019-02-15 DIAGNOSIS — Z01812 Encounter for preprocedural laboratory examination: Secondary | ICD-10-CM | POA: Diagnosis present

## 2019-02-15 DIAGNOSIS — Z20828 Contact with and (suspected) exposure to other viral communicable diseases: Secondary | ICD-10-CM | POA: Diagnosis not present

## 2019-02-16 LAB — SARS CORONAVIRUS 2 (TAT 6-24 HRS): SARS Coronavirus 2: NEGATIVE

## 2019-02-18 NOTE — Discharge Instructions (Signed)
General Anesthesia, Adult, Care After °This sheet gives you information about how to care for yourself after your procedure. Your health care provider may also give you more specific instructions. If you have problems or questions, contact your health care provider. °What can I expect after the procedure? °After the procedure, the following side effects are common: °· Pain or discomfort at the IV site. °· Nausea. °· Vomiting. °· Sore throat. °· Trouble concentrating. °· Feeling cold or chills. °· Weak or tired. °· Sleepiness and fatigue. °· Soreness and body aches. These side effects can affect parts of the body that were not involved in surgery. °Follow these instructions at home: ° °For at least 24 hours after the procedure: °· Have a responsible adult stay with you. It is important to have someone help care for you until you are awake and alert. °· Rest as needed. °· Do not: °? Participate in activities in which you could fall or become injured. °? Drive. °? Use heavy machinery. °? Drink alcohol. °? Take sleeping pills or medicines that cause drowsiness. °? Make important decisions or sign legal documents. °? Take care of children on your own. °Eating and drinking °· Follow any instructions from your health care provider about eating or drinking restrictions. °· When you feel hungry, start by eating small amounts of foods that are soft and easy to digest (bland), such as toast. Gradually return to your regular diet. °· Drink enough fluid to keep your urine pale yellow. °· If you vomit, rehydrate by drinking water, juice, or clear broth. °General instructions °· If you have sleep apnea, surgery and certain medicines can increase your risk for breathing problems. Follow instructions from your health care provider about wearing your sleep device: °? Anytime you are sleeping, including during daytime naps. °? While taking prescription pain medicines, sleeping medicines, or medicines that make you drowsy. °· Return to  your normal activities as told by your health care provider. Ask your health care provider what activities are safe for you. °· Take over-the-counter and prescription medicines only as told by your health care provider. °· If you smoke, do not smoke without supervision. °· Keep all follow-up visits as told by your health care provider. This is important. °Contact a health care provider if: °· You have nausea or vomiting that does not get better with medicine. °· You cannot eat or drink without vomiting. °· You have pain that does not get better with medicine. °· You are unable to pass urine. °· You develop a skin rash. °· You have a fever. °· You have redness around your IV site that gets worse. °Get help right away if: °· You have difficulty breathing. °· You have chest pain. °· You have blood in your urine or stool, or you vomit blood. °Summary °· After the procedure, it is common to have a sore throat or nausea. It is also common to feel tired. °· Have a responsible adult stay with you for the first 24 hours after general anesthesia. It is important to have someone help care for you until you are awake and alert. °· When you feel hungry, start by eating small amounts of foods that are soft and easy to digest (bland), such as toast. Gradually return to your regular diet. °· Drink enough fluid to keep your urine pale yellow. °· Return to your normal activities as told by your health care provider. Ask your health care provider what activities are safe for you. °This information is not   intended to replace advice given to you by your health care provider. Make sure you discuss any questions you have with your health care provider. °Document Released: 08/29/2000 Document Revised: 05/26/2017 Document Reviewed: 01/06/2017 °Elsevier Patient Education © 2020 [employer] °Cataract Surgery, Care After °This sheet gives you information about how to care for yourself after your procedure. Your health care provider may also  give you more specific instructions. If you have problems or questions, contact your health care provider. °What can I expect after the procedure? °After the procedure, it is common to have: °· Itching. °· Discomfort. °· Fluid discharge. °· Sensitivity to light and to touch. °· Bruising in or around the eye. °· Mild blurred vision. °Follow these instructions at home: °Eye care ° °· Do not touch or rub your eyes. °· Protect your eyes as told by your health care provider. You may be told to wear a protective eye shield or sunglasses. °· Do not put a contact lens into the affected eye or eyes until your health care provider approves. °· Keep the area around your eye clean and dry: °? Avoid swimming. °? Do not allow water to hit you directly in the face while showering. °? Keep soap and shampoo out of your eyes. °· Check your eye every day for signs of infection. Watch for: °? Redness, swelling, or pain. °? Fluid, blood, or pus. °? Warmth. °? A bad smell. °? Vision that is getting worse. °? Sensitivity that is getting worse. °Activity °· Do not drive for 24 hours if you were given a sedative during your procedure. °· Avoid strenuous activities, such as playing contact sports, for as long as told by your health care provider. °· Do not drive or use heavy machinery until your health care provider approves. °· Do not bend or lift heavy objects. Bending increases pressure in the eye. You can walk, climb stairs, and do light household chores. °· Ask your health care provider when you can return to work. If you work in a dusty environment, you may be advised to wear protective eyewear for a period of time. °General instructions °· Take or apply over-the-counter and prescription medicines only as told by your health care provider. This includes eye drops. °· Keep all follow-up visits as told by your health care provider. This is important. °Contact a health care provider if: °· You have increased bruising around your  eye. °· You have pain that is not helped with medicine. °· You have a fever. °· You have redness, swelling, or pain in your eye. °· You have fluid, blood, or pus coming from your incision. °· Your vision gets worse. °· Your sensitivity to light gets worse. °Get help right away if: °· You have sudden loss of vision. °· You see flashes of light or spots (floaters). °· You have severe eye pain. °· You develop nausea or vomiting. °Summary °· After your procedure, it is common to have itching, discomfort, bruising, fluid discharge, or sensitivity to light. °· Follow instructions from your health care provider about caring for your eye after the procedure. °· Do not rub your eye after the procedure. You may need to wear eye protection or sunglasses. Do not wear contact lenses. Keep the area around your eye clean and dry. °· Avoid activities that require a lot of effort. These include playing sports and lifting heavy objects. °· Contact a health care provider if you have increased bruising, pain that does not go away, or a fever. Get   help right away if you suddenly lose your vision, see flashes of light or spots, or have severe pain in the eye. °This information is not intended to replace advice given to you by your health care provider. Make sure you discuss any questions you have with your health care provider. °Document Released: 12/10/2004 Document Revised: 11/20/2017 Document Reviewed: 11/20/2017 °Elsevier Patient Education © 2020 [employer] ° °

## 2019-02-20 ENCOUNTER — Other Ambulatory Visit: Payer: Self-pay

## 2019-02-20 ENCOUNTER — Ambulatory Visit: Payer: Medicare Other | Admitting: Anesthesiology

## 2019-02-20 ENCOUNTER — Ambulatory Visit
Admission: RE | Admit: 2019-02-20 | Discharge: 2019-02-20 | Disposition: A | Discharge status: Home / Self Care | Payer: Medicare Other | Attending: Ophthalmology | Admitting: Ophthalmology

## 2019-02-20 ENCOUNTER — Encounter
Admission: RE | Disposition: A | Discharge status: Home / Self Care | Payer: Self-pay | Source: Home / Self Care | Attending: Ophthalmology

## 2019-02-20 DIAGNOSIS — N189 Chronic kidney disease, unspecified: Secondary | ICD-10-CM | POA: Diagnosis not present

## 2019-02-20 DIAGNOSIS — I129 Hypertensive chronic kidney disease with stage 1 through stage 4 chronic kidney disease, or unspecified chronic kidney disease: Secondary | ICD-10-CM | POA: Diagnosis not present

## 2019-02-20 DIAGNOSIS — Z79899 Other long term (current) drug therapy: Secondary | ICD-10-CM | POA: Insufficient documentation

## 2019-02-20 DIAGNOSIS — Z7984 Long term (current) use of oral hypoglycemic drugs: Secondary | ICD-10-CM | POA: Diagnosis not present

## 2019-02-20 DIAGNOSIS — Z7982 Long term (current) use of aspirin: Secondary | ICD-10-CM | POA: Diagnosis not present

## 2019-02-20 DIAGNOSIS — Z8546 Personal history of malignant neoplasm of prostate: Secondary | ICD-10-CM | POA: Insufficient documentation

## 2019-02-20 DIAGNOSIS — E1136 Type 2 diabetes mellitus with diabetic cataract: Secondary | ICD-10-CM | POA: Diagnosis not present

## 2019-02-20 DIAGNOSIS — H2511 Age-related nuclear cataract, right eye: Secondary | ICD-10-CM | POA: Diagnosis not present

## 2019-02-20 DIAGNOSIS — E7800 Pure hypercholesterolemia, unspecified: Secondary | ICD-10-CM | POA: Insufficient documentation

## 2019-02-20 DIAGNOSIS — E1122 Type 2 diabetes mellitus with diabetic chronic kidney disease: Secondary | ICD-10-CM | POA: Diagnosis not present

## 2019-02-20 HISTORY — PX: CATARACT EXTRACTION W/PHACO: SHX586

## 2019-02-20 LAB — GLUCOSE, CAPILLARY
Glucose-Capillary: 114 mg/dL — ABNORMAL HIGH (ref 70–99)
Glucose-Capillary: 107 mg/dL — ABNORMAL HIGH (ref 70–99)

## 2019-02-20 SURGERY — PHACOEMULSIFICATION, CATARACT, WITH IOL INSERTION
Anesthesia: Monitor Anesthesia Care | Site: Eye | Laterality: Right

## 2019-02-20 MED ORDER — NA HYALUR & NA CHOND-NA HYALUR 0.4-0.35 ML IO KIT
PACK | INTRAOCULAR | Status: DC | PRN
  Administered 2019-02-20: 1 mL via INTRAOCULAR

## 2019-02-20 MED ORDER — ACETAMINOPHEN 160 MG/5ML PO SOLN: 325.0000 mg | ORAL | Status: DC | PRN

## 2019-02-20 MED ORDER — EPINEPHRINE PF 1 MG/ML IJ SOLN
INTRAOCULAR | Status: DC | PRN
  Administered 2019-02-20: 12:00:00 75 mL via OPHTHALMIC

## 2019-02-20 MED ORDER — LIDOCAINE HCL (PF) 2 % IJ SOLN
INTRAOCULAR | Status: DC | PRN
  Administered 2019-02-20: 12:00:00 1 mL

## 2019-02-20 MED ORDER — ARMC OPHTHALMIC DILATING DROPS: 1.0000 "application " | OPHTHALMIC | Status: DC | PRN

## 2019-02-20 MED ORDER — MOXIFLOXACIN HCL 0.5 % OP SOLN
1.0000 [drp] | OPHTHALMIC | Status: DC | PRN
  Administered 2019-02-20 (×3): 1 [drp] via OPHTHALMIC

## 2019-02-20 MED ORDER — ONDANSETRON HCL 4 MG/2ML IJ SOLN: 4.0000 mg | Freq: Once | INTRAMUSCULAR | Status: DC | PRN

## 2019-02-20 MED ORDER — ONDANSETRON HCL 4 MG/2ML IJ SOLN
INTRAMUSCULAR | Status: DC | PRN
  Administered 2019-02-20: 4 mg via INTRAVENOUS

## 2019-02-20 MED ORDER — MIDAZOLAM HCL 2 MG/2ML IJ SOLN
INTRAMUSCULAR | Status: DC | PRN
  Administered 2019-02-20 (×2): 1 mg via INTRAVENOUS

## 2019-02-20 MED ORDER — ACETAMINOPHEN 325 MG PO TABS: 325.0000 mg | ORAL_TABLET | ORAL | Status: DC | PRN

## 2019-02-20 MED ORDER — BRIMONIDINE TARTRATE-TIMOLOL 0.2-0.5 % OP SOLN
OPHTHALMIC | Status: DC | PRN
  Administered 2019-02-20: 1 [drp] via OPHTHALMIC

## 2019-02-20 MED ORDER — LACTATED RINGERS IV SOLN: 100.0000 mL/h | INTRAVENOUS | Status: DC

## 2019-02-20 MED ORDER — TETRACAINE HCL 0.5 % OP SOLN
1.0000 [drp] | OPHTHALMIC | Status: DC | PRN
  Administered 2019-02-20 (×3): 1 [drp] via OPHTHALMIC

## 2019-02-20 MED ORDER — MOXIFLOXACIN HCL 0.5 % OP SOLN: 1.0000 [drp] | OPHTHALMIC | Status: DC | PRN

## 2019-02-20 MED ORDER — FENTANYL CITRATE (PF) 100 MCG/2ML IJ SOLN
INTRAMUSCULAR | Status: DC | PRN
  Administered 2019-02-20: 50 ug via INTRAVENOUS

## 2019-02-20 MED ORDER — TETRACAINE HCL 0.5 % OP SOLN: 1.0000 [drp] | OPHTHALMIC | Status: DC | PRN

## 2019-02-20 MED ORDER — ARMC OPHTHALMIC DILATING DROPS
1.0000 "application " | OPHTHALMIC | Status: DC | PRN
  Administered 2019-02-20 (×3): 1 via OPHTHALMIC

## 2019-02-20 MED ORDER — CEFUROXIME OPHTHALMIC INJECTION 1 MG/0.1 ML
INJECTION | OPHTHALMIC | Status: DC | PRN
  Administered 2019-02-20: 0.1 mL via INTRACAMERAL

## 2019-02-20 SURGICAL SUPPLY — 28 items
CANNULA ANT/CHMB 27G (MISCELLANEOUS) ×1 IMPLANT
CANNULA ANT/CHMB 27GA (MISCELLANEOUS) ×3 IMPLANT
GLOVE SURG LX 7.5 STRW (GLOVE) ×4
GLOVE SURG LX STRL 7.5 STRW (GLOVE) ×1 IMPLANT
GLOVE SURG TRIUMPH 8.0 PF LTX (GLOVE) ×3 IMPLANT
GOWN STRL REUS W/ TWL LRG LVL3 (GOWN DISPOSABLE) ×2 IMPLANT
GOWN STRL REUS W/TWL LRG LVL3 (GOWN DISPOSABLE) ×4
LENS IOL IQ PAN TRC 30 20.0 IMPLANT
LENS IOL PANOP TORIC 30 20.0 ×1 IMPLANT
LENS IOL PANOPTIX TORIC 20.0 ×2 IMPLANT
MARKER SKIN DUAL TIP RULER LAB (MISCELLANEOUS) ×3 IMPLANT
NDL FILTER BLUNT 18X1 1/2 (NEEDLE) IMPLANT
NDL RETROBULBAR .5 NSTRL (NEEDLE) IMPLANT
NEEDLE FILTER BLUNT 18X 1/2SAF (NEEDLE)
NEEDLE FILTER BLUNT 18X1 1/2 (NEEDLE) IMPLANT
PACK CATARACT BRASINGTON (MISCELLANEOUS) ×3 IMPLANT
PACK EYE AFTER SURG (MISCELLANEOUS) ×3 IMPLANT
PACK OPTHALMIC (MISCELLANEOUS) ×3 IMPLANT
RING MALYGIN 7.0 (MISCELLANEOUS) IMPLANT
SUT ETHILON 10-0 CS-B-6CS-B-6 (SUTURE)
SUT VICRYL  9 0 (SUTURE)
SUT VICRYL 9 0 (SUTURE) IMPLANT
SUTURE EHLN 10-0 CS-B-6CS-B-6 (SUTURE) IMPLANT
SYR 3ML LL SCALE MARK (SYRINGE) ×3 IMPLANT
SYR 5ML LL (SYRINGE) IMPLANT
SYR TB 1ML LUER SLIP (SYRINGE) ×3 IMPLANT
WATER STERILE IRR 500ML POUR (IV SOLUTION) ×3 IMPLANT
WIPE NON LINTING 3.25X3.25 (MISCELLANEOUS) ×3 IMPLANT

## 2019-02-20 NOTE — Transfer of Care (Signed)
Immediate Anesthesia Transfer of Care Note  Patient: James Watson  Procedure(s) Performed: CATARACT EXTRACTION PHACO AND INTRAOCULAR LENS PLACEMENT (IOC) RIGHT DIABETIC PANOPTIX TORIC  0 ;54 11.0% 6.01 (Right Eye)  Patient Location: PACU  Anesthesia Type: MAC  Level of Consciousness: awake, alert  and patient cooperative  Airway and Oxygen Therapy: Patient Spontanous Breathing and Patient connected to supplemental oxygen  Post-op Assessment: Post-op Vital signs reviewed, Patient's Cardiovascular Status Stable, Respiratory Function Stable, Patent Airway and No signs of Nausea or vomiting  Post-op Vital Signs: Reviewed and stable  Complications: No apparent anesthesia complications

## 2019-02-20 NOTE — Anesthesia Preprocedure Evaluation (Signed)
Anesthesia Evaluation  Patient identified by MRN, date of birth, ID band Patient awake    Reviewed: Allergy & Precautions, H&P , NPO status , Patient's Chart, lab work & pertinent test results, reviewed documented beta blocker date and time   Airway Mallampati: II  TM Distance: >3 FB Neck ROM: Limited    Dental no notable dental hx.    Pulmonary neg pulmonary ROS,    Pulmonary exam normal breath sounds clear to auscultation       Cardiovascular Exercise Tolerance: Good hypertension, Normal cardiovascular exam Rhythm:regular Rate:Normal     Neuro/Psych TIAnegative psych ROS   GI/Hepatic negative GI ROS, Neg liver ROS,   Endo/Other  diabetes  Renal/GU CRFRenal disease  negative genitourinary   Musculoskeletal  (+) Arthritis ,   Abdominal   Peds  Hematology negative hematology ROS (+)   Anesthesia Other Findings   Reproductive/Obstetrics negative OB ROS                             Anesthesia Physical  Anesthesia Plan  ASA: II  Anesthesia Plan: MAC   Post-op Pain Management:    Induction: Intravenous  PONV Risk Score and Plan:   Airway Management Planned: Nasal Cannula  Additional Equipment:   Intra-op Plan:   Post-operative Plan:   Informed Consent: I have reviewed the patients History and Physical, chart, labs and discussed the procedure including the risks, benefits and alternatives for the proposed anesthesia with the patient or authorized representative who has indicated his/her understanding and acceptance.     Dental Advisory Given  Plan Discussed with: CRNA, Anesthesiologist and Surgeon  Anesthesia Plan Comments:         Anesthesia Quick Evaluation

## 2019-02-20 NOTE — H&P (Signed)

## 2019-02-20 NOTE — Op Note (Signed)
LOCATION:  [facility]   PREOPERATIVE DIAGNOSIS:  Nuclear sclerotic cataract of the right eye.  H25.11   POSTOPERATIVE DIAGNOSIS:  Nuclear sclerotic cataract of the right eye.   PROCEDURE:  Phacoemulsification with Toric posterior chamber intraocular lens placement of the right eye.  Ultrasound time: Procedure(s): CATARACT EXTRACTION PHACO AND INTRAOCULAR LENS PLACEMENT (IOC) RIGHT DIABETIC PANOPTIX TORIC  0 ;54 11.0% 6.01 (Right)  LENS:   Implant Name Type Inv. Item Serial No. Manufacturer Lot No. LRB No. Used Action  ALCON ACRYSOF IQ PANOPTIX TORIC LENS Intraocular Lens  [id number] ALCON  Right 1 Implanted     [id number] Toric intraocular lens with 1.5 diopters of cylindrical power with axis orientation at 37 degrees.    SURGEON:  Wyonia Hough, MD   ANESTHESIA: Topical with tetracaine drops and 2% Xylocaine jelly, augmented with 1% preservative-free intracameral lidocaine. .   COMPLICATIONS:  None.   DESCRIPTION OF PROCEDURE:  The patient was identified in the holding room and transported to the operating suite and placed in the supine position under the operating microscope.  The right eye was identified as the operative eye, and it was prepped and draped in the usual sterile ophthalmic fashion.    A clear-corneal paracentesis incision was made at the 12:00 position.  0.5 ml of preservative-free 1% lidocaine was injected into the anterior chamber. The anterior chamber was filled with Viscoat.  A 2.4 millimeter near clear corneal incision was then made at the 9:00 position.  A cystotome and capsulorrhexis forceps were then used to make a curvilinear capsulorrhexis.  Hydrodissection and hydrodelineation were then performed using balanced salt solution.   Phacoemulsification was then used in stop and chop fashion to remove the lens, nucleus and epinucleus.  The remaining cortex was aspirated using the irrigation and aspiration handpiece.  Provisc viscoelastic was then  placed into the capsular bag to distend it for lens placement.  The Verion digital marker was used to align the implant at the intended axis.   A Toric lens was then injected into the capsular bag.  It was rotated clockwise until the axis marks on the lens were approximately 15 degrees in the counterclockwise direction to the intended alignment.  The viscoelastic was aspirated from the eye using the irrigation aspiration handpiece.  Then, a Koch spatula through the sideport incision was used to rotate the lens in a clockwise direction until the axis markings of the intraocular lens were lined up with the Verion alignment.  Balanced salt solution was then used to hydrate the wounds. Cefuroxime 0.1 ml of a 10mg /ml solution was injected into the anterior chamber for a dose of 1 mg of intracameral antibiotic at the completion of the case.    The eye was noted to have a physiologic pressure and there was no wound leak noted.   Timolol and Brimonidine drops were applied to the eye.  The patient was taken to the recovery room in stable condition having had no complications of anesthesia or surgery.  Yena Tisby 02/20/2019, 12:17 PM

## 2019-02-20 NOTE — Anesthesia Postprocedure Evaluation (Signed)
Anesthesia Post Note  Patient: James Watson  Procedure(s) Performed: CATARACT EXTRACTION PHACO AND INTRAOCULAR LENS PLACEMENT (IOC) RIGHT DIABETIC PANOPTIX TORIC  0 ;54 11.0% 6.01 (Right Eye)  Patient location during evaluation: PACU Anesthesia Type: MAC Level of consciousness: awake and alert Pain management: pain level controlled Vital Signs Assessment: post-procedure vital signs reviewed and stable Respiratory status: spontaneous breathing, nonlabored ventilation, respiratory function stable and patient connected to nasal cannula oxygen Cardiovascular status: stable and blood pressure returned to baseline Postop Assessment: no apparent nausea or vomiting Anesthetic complications: no    Alisa Graff

## 2019-02-20 NOTE — Anesthesia Procedure Notes (Signed)
Procedure Name: MAC Date/Time: 02/20/2019 11:53 AM Performed by: Cameron Ali, CRNA Pre-anesthesia Checklist: Patient identified, Emergency Drugs available, Suction available, Timeout performed and Patient being monitored Patient Re-evaluated:Patient Re-evaluated prior to induction Oxygen Delivery Method: Nasal cannula Placement Confirmation: positive ETCO2

## 2019-02-21 ENCOUNTER — Encounter: Payer: Self-pay | Admitting: Ophthalmology

## 2019-04-22 NOTE — Progress Notes (Signed)
[facility]  [address], [address] [place], [place] [address] Phone: [phone]  Fax: [phone]   Clinic Day:  04/24/2019  Referring physician: Dion Body, MD  Chief Complaint: James Watson is a 78 y.o. male with a history of chronic thrombocytopenia, renal insufficiency, and mild erythrocytosis who is seen for 6 month assessment.   HPI: The patient was last seen in the hematology clinic on 10/23/2018. At that time, he was doing well. Exam was unremarkable. Hemoglobin 16.9. Platelet counts were 166,000 (normal). I discussed small volume phlebotomy (300 cc) to maintain hemoglobin < 16.5.  He underwent phlebotomy (300 cc).  Patient continued oral B12 1,000 mcg daily.   He had a phlebotomy on 01/21/2019. Labs revealed hematocrit 48.9, hemoglobin 16.8, MCV 94.6, platelets 155,000. WBC 6,200.   During the interim, he has felt fine.  He felt good after his phlebotomy on 01/21/2019. His knee pain is improving. His back pain is improving.    Past Medical History:  Diagnosis Date  . Adenomatous colon polyp 06/18/2015  . Basal cell carcinoma   . BCC (basal cell carcinoma)    followed by Dr. Evorn Gong   . Chronic renal insufficiency   . Diabetes mellitus without complication ([place])   . Hypercholesteremia   . Hypertension   . Osteoarthritis   . Prostate cancer ([place])   . Squamous cell carcinoma   . TIA (transient ischemic attack)     Past Surgical History:  Procedure Laterality Date  . CATARACT EXTRACTION W/PHACO Left 01/30/2019   Procedure: CATARACT EXTRACTION PHACO AND INTRAOCULAR LENS PLACEMENT ([place]) left diabetic  panoptix lens  00:59.4  15.3  9.09;  Surgeon: Leandrew Koyanagi, MD;  Location: [place];  Service: Ophthalmology;  Laterality: Left;  Diabetic - oral meds  . CATARACT EXTRACTION W/PHACO Right 02/20/2019   Procedure: CATARACT EXTRACTION PHACO AND INTRAOCULAR LENS PLACEMENT ([facility]) RIGHT DIABETIC PANOPTIX TORIC  0 ;54 11.0%  6.01;  Surgeon: Leandrew Koyanagi, MD;  Location: [place];  Service: Ophthalmology;  Laterality: Right;  . COLONOSCOPY WITH PROPOFOL N/A 07/16/2015   Procedure: COLONOSCOPY WITH PROPOFOL;  Surgeon: Manya Silvas, MD;  Location: [facility] ENDOSCOPY;  Service: Endoscopy;  Laterality: N/A;  . left shoulder arthroscopy    . mtp repair    . PROSTATECTOMY    . tm repair      History reviewed. No pertinent family history.  Social History:  reports that he has never smoked. He has never used smokeless tobacco. He reports current alcohol use of about 2.0 standard drinks of alcohol per week. No history on file for drug. 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 He travels frequently. His 77 year old granddaughter had COVID-19 a while back. She lost her sense of smell and taste. He lives in [place].  He moved to [place].  The patient is alone today.  Allergies:  Allergies  Allergen Reactions  . Enalapril Maleate Other (See Comments)    Difficulty breathing  . Hydrocodone Other (See Comments)  . Hydrocodone-Acetaminophen Nausea Only    Hallucinations    Current Medications: Current Outpatient Medications  Medication Sig Dispense Refill  . acetaminophen (TYLENOL) 650 MG CR tablet Take 1,300 mg by mouth daily as needed for pain.    [name] amLODipine (NORVASC) 10 MG tablet Take 10 mg by mouth daily.     [name] aspirin EC 81 MG tablet Take 81  mg by mouth daily.     [name] atorvastatin (LIPITOR) 80 MG tablet TAKE 1/2 TABLET BY MOUTH EVERY NIGHT  1  . Blood Glucose Monitoring Suppl (ONE TOUCH ULTRA 2) W/DEVICE KIT USE AS DIRECTED. DX: E11.22, N18.2  0  . Cholecalciferol (VITAMIN D-1000 MAX ST) 1000 UNITS tablet Take 2,000 Units by mouth daily.     . Coenzyme Q10 (CO Q-10) 100 MG CAPS Take 1 capsule by mouth daily.     [name] glimepiride (AMARYL) 1 MG tablet TAKE 1 TABLET (1 MG TOTAL) BY MOUTH  DAILY WITH BREAKFAST.  3  . hydrochlorothiazide (HYDRODIURIL) 12.5 MG tablet Take 12.5 mg by mouth daily.    . hydroxychloroquine (PLAQUENIL) 200 MG tablet Take 200 mg by mouth daily.     [name] losartan (COZAAR) 100 MG tablet TAKE 1 TABLET (100 MG TOTAL) BY MOUTH ONCE DAILY.  1  . ONE TOUCH ULTRA TEST test strip USE AS DIRECTED. CHECK CBG'S FASTING ONCE DAILY. DX: E11.22, N18.2  11  . ONETOUCH DELICA LANCETS FINE MISC Use 1 Units as directed. Check CBG's fasting once daily. Dx: E11.22, N18.2    . Turmeric 400 MG CAPS Take 1 capsule by mouth 2 (two) times a day.     . vitamin B-12 (CYANOCOBALAMIN) 1000 MCG tablet Take 1,000 mcg by mouth daily.     No current facility-administered medications for this visit.     Review of Systems  Constitutional: Negative.  Negative for chills, diaphoresis, fever, malaise/fatigue and weight loss (stable).       Feels "fine".  HENT: Negative.  Negative for congestion, ear pain, hearing loss, nosebleeds, sinus pain and sore throat.   Eyes: Negative.  Negative for blurred vision, double vision, photophobia and pain.  Respiratory: Negative.  Negative for cough, sputum production, shortness of breath and wheezing.   Cardiovascular: Negative.  Negative for chest pain, palpitations, leg swelling and PND.  Gastrointestinal: Negative.  Negative for abdominal pain, blood in stool, constipation, diarrhea, heartburn, melena, nausea and vomiting.  Genitourinary: Negative.  Negative for dysuria, frequency, hematuria and urgency.  Musculoskeletal: Negative for back pain (chronic, improving), joint pain (chronic arthritis in left hip, knees; improving) and myalgias.  Skin: Negative.  Negative for itching and rash.  Neurological: Negative for dizziness, sensory change, speech change, focal weakness, weakness and headaches.  Endo/Heme/Allergies: Does not bruise/bleed easily.       Diabetes.  Psychiatric/Behavioral: Negative.  Negative for depression and memory loss. The patient is  not nervous/anxious and does not have insomnia.   All other systems reviewed and are negative.  Performance status (ECOG): 1  Vitals Blood pressure 130/77, pulse 63, temperature 97.9 F (36.6 C), temperature source Oral, resp. rate 16, weight 176 lb 7.7 oz (80 kg), SpO2 97 %.   Physical Exam  Constitutional: He is oriented to person, place, and time. He appears well-developed and well-nourished. No distress.  HENT:  Head: Normocephalic and atraumatic.  Mouth/Throat: Oropharynx is clear and moist. No oropharyngeal exudate.  Gray hair.  Male pattern baldness.   Eyes: Pupils are equal, round, and reactive to light. Conjunctivae and EOM are normal. No scleral icterus.  Glasses.  Blue eyes.  Neck: Normal range of motion. Neck supple. No JVD present.  Cardiovascular: Normal rate, regular rhythm and normal heart sounds. Exam reveals no gallop.  No murmur heard. Pulmonary/Chest: Effort normal and breath sounds normal. No respiratory distress. He has no wheezes. He has no rales.  Abdominal: Soft. Bowel sounds are normal. He exhibits no distension  and no mass. There is no abdominal tenderness. There is no rebound and no guarding.  Musculoskeletal: Normal range of motion.        General: No edema.  Lymphadenopathy:       Head (right side): No preauricular, no posterior auricular and no occipital adenopathy present.       Head (left side): No preauricular, no posterior auricular and no occipital adenopathy present.    He has no cervical adenopathy.    He has no axillary adenopathy.       Right: No inguinal and no supraclavicular adenopathy present.       Left: No inguinal and no supraclavicular adenopathy present.  Neurological: He is alert and oriented to person, place, and time.  Skin: Skin is warm and dry. No rash noted. He is not diaphoretic. No erythema. No pallor.  Psychiatric: He has a normal mood and affect. His behavior is normal. Judgment and thought content normal.  Nursing note and  vitals reviewed.   Appointment on 04/24/2019  Component Date Value Ref Range Status  . WBC 04/24/2019 5.1  4.0 - 10.5 K/uL Final  . RBC 04/24/2019 5.11  4.22 - 5.81 MIL/uL Final  . Hemoglobin 04/24/2019 16.6  13.0 - 17.0 g/dL Final  . HCT 04/24/2019 48.1  39.0 - 52.0 % Final  . MCV 04/24/2019 94.1  80.0 - 100.0 fL Final  . MCH 04/24/2019 32.5  26.0 - 34.0 pg Final  . MCHC 04/24/2019 34.5  30.0 - 36.0 g/dL Final  . RDW 04/24/2019 12.6  11.5 - 15.5 % Final  . Platelets 04/24/2019 143* 150 - 400 K/uL Final  . nRBC 04/24/2019 0.0  0.0 - 0.2 % Final  . Neutrophils Relative % 04/24/2019 72  % Final  . Neutro Abs 04/24/2019 3.6  1.7 - 7.7 K/uL Final  . Lymphocytes Relative 04/24/2019 15  % Final  . Lymphs Abs 04/24/2019 0.8  0.7 - 4.0 K/uL Final  . Monocytes Relative 04/24/2019 9  % Final  . Monocytes Absolute 04/24/2019 0.5  0.1 - 1.0 K/uL Final  . Eosinophils Relative 04/24/2019 3  % Final  . Eosinophils Absolute 04/24/2019 0.2  0.0 - 0.5 K/uL Final  . Basophils Relative 04/24/2019 1  % Final  . Basophils Absolute 04/24/2019 0.1  0.0 - 0.1 K/uL Final  . Immature Granulocytes 04/24/2019 0  % Final  . Abs Immature Granulocytes 04/24/2019 0.02  0.00 - 0.07 K/uL Final   Performed at [facility], [address] [place], [place] [address]    Assessment:  James Watson is a 78 y.o. male withmild thrombocytopeniadating back to at least 2013/2015 with no significant change over the years. Peripheral smear on 04/06/2016 revealed mild thrombocytopenia with no abnormal or immature cells. ANA, lupus anticoagulant, SPEP, hepatitis C antibody were negative. 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 Because of platelet count stability, he deferred bone marrow biopsy.  Platelet counthas been followed: 126,000 on 09/11/2012, 139,000 on 04/06/2016, 141,000 on 04/20/2016, 154,000 on 08/18/2016, 143,000 on 04/18/2017, 135,000 on 05/03/2017, 153,000 on  04/17/2018, 165,000 on 05/08/2018, 154,000 on 06/11/2018, 143,000 on 07/17/2018, 157,000 on 09/21/2018, and 166,000 on 10/19/2018.  Work-up on 04/18/2017 revealed a hematocrit of 48.4, hemoglobin 16.4, MCV 95, platelets 143,000, WBC 3300 with an ANC of 2300. ALC was 600. Normal studies included: LFTs, folate (16.7), B12 (473), TSH, H pylori IgG, and hepatitis B core antibody total. Copper level was 94 (72-166) on 05/03/2017.   Work-up on  11/12/2019revealed a hematocrit of 51.1, hemoglobin 17.1, MCV 94.5, platelets 153,000, WBC 4300 with an ANC of 2900. Creatininewas 1.72 (CrCl 36 ml/min). B12was 227 (low). Normal studiesincluded: LFTs, folate (11.6), SPEP, epo level (12.8), carbon monoxide level (2.6), testosterone (257), JAK2, exon 12-15, CALR, and MPL. Kappa free light chains were 25.2, lambda free light chains 11.0 with a ratio of 2.29 (0.26 - 1.65). 24 hour UPEPrevealed no monoclonal protein. There was 406 mg protein/24 hours. 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 Ferritin, iron studies and BCR-ABL were normal on 05/08/2018.  He has ahistory of B12 deficiency. B12level was 248 (low normal) on 04/06/2016. MMAwas 428 (high) on 04/20/2016 and 462 (high) on 04/18/2017 poissibly due to renal insufficiency. He took B12 IM for 9 months. He went out of town and the injections stopped. Anti-parietal antibody and intrinsic factor antibody were negative on 05/03/2018. He began oral B12on 05/08/2018. B12 was 442 on 06/11/2018.  He has a history of adenomatous colonic polyps. Last colonoscopy was in 07/2015. He has a history of prostate cancertreated with prostatectomy.   He has chronic kidney disease. Creatinine was 1.81 on 04/18/2017 and 1.72 on 04/07/2018. Calcium, albumen, and protein were normal. Myeloma panel on 04/06/2016 revealed no monoclonal protein and normal immunoglobulins.SPEP on 04/17/2018 revealed no monoclonal protein.IFE urineon 04/19/2017  revealed 618 mg protein in 24 hours (30-150). Free light chain excretion rate was 15.5 mg/L (normal), lambda free light chains 0.76 mg/L (normal) with a free kappa/lambda ratio 20.39 (2.04-10.37). 24 hour UPEP revealed no monoclonal protein. There was 406 mg protein/24 hours. 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  He has mild polycythemia. Hemoglobin was 16.6 on 05/03/2018. He denies any cardiopulmonary disease, tobacco use, sleep apnea or testosterone use. CXRon 05/08/2018 revealed no acute cardiopulmonary abnormalities.  Symptomatically, he feels fine.  Exam is unremarkable.  Plan: 1.   Labs today: CBC with diff, ferritin. 2. Mild thrombocytopenia Platelet count is 143,000.  No excess bruising or bleeding.             Continue to monitor counts. 3. B12 deficiency B12 level was 227 (low) on 04/17/2018. B12 was 442 on 06/11/2018 after starting oral B12. Continue oral B12.             No evidence of pernicious anemia    Intrinsic factor and antiparietal cell antibodies were negative in 04/2018.  Check B12 and folate annually. 4. Mild erythrocytosis Hemoglobin 16.6.   Normal studies included: Epo level, carbon monoxide level, testosterone, and JAK2 with reflex. Continue small volume phlebotomy (300 cc) to maintain hemoglobin < 16.5.  Phlebotomy today. 5.   RTC every 3 months x 2 for labs (CBC) and +/- phlebotomy 6.   RTC in 9 months for MD assessment, labs (CBC with diff, ferritin), and +/- phlebotomy.    I discussed the assessment and treatment plan with the patient.  The patient was provided an opportunity to ask questions and all were answered.  The patient agreed with the plan and demonstrated an understanding of the instructions.  The patient was advised to call back if the symptoms worsen or if the condition fails to improve as anticipated.   Lequita Asal, MD, PhD    04/24/2019, 1:25 PM  I, Selena Batten, am acting as scribe for [employer]. Mike Gip, MD, PhD.  I, Melissa C. Mike Gip, MD, have reviewed the above documentation for accuracy and completeness, and I agree with the above.

## 2019-04-23 ENCOUNTER — Encounter: Payer: Self-pay | Admitting: Hematology and Oncology

## 2019-04-23 ENCOUNTER — Other Ambulatory Visit: Payer: Self-pay

## 2019-04-24 ENCOUNTER — Inpatient Hospital Stay: Payer: Medicare Other

## 2019-04-24 ENCOUNTER — Inpatient Hospital Stay: Payer: Medicare Other | Attending: Hematology and Oncology | Admitting: Hematology and Oncology

## 2019-04-24 ENCOUNTER — Encounter: Payer: Self-pay | Admitting: Hematology and Oncology

## 2019-04-24 VITALS — BP 150/77 | HR 64 | Temp 97.0°F | Resp 16

## 2019-04-24 VITALS — BP 130/77 | HR 63 | Temp 97.9°F | Resp 16 | Wt 176.5 lb

## 2019-04-24 DIAGNOSIS — N189 Chronic kidney disease, unspecified: Secondary | ICD-10-CM | POA: Diagnosis not present

## 2019-04-24 DIAGNOSIS — D751 Secondary polycythemia: Secondary | ICD-10-CM

## 2019-04-24 DIAGNOSIS — M25569 Pain in unspecified knee: Secondary | ICD-10-CM | POA: Insufficient documentation

## 2019-04-24 DIAGNOSIS — Z79899 Other long term (current) drug therapy: Secondary | ICD-10-CM | POA: Insufficient documentation

## 2019-04-24 DIAGNOSIS — Z8673 Personal history of transient ischemic attack (TIA), and cerebral infarction without residual deficits: Secondary | ICD-10-CM | POA: Insufficient documentation

## 2019-04-24 DIAGNOSIS — Z8546 Personal history of malignant neoplasm of prostate: Secondary | ICD-10-CM | POA: Insufficient documentation

## 2019-04-24 DIAGNOSIS — E538 Deficiency of other specified B group vitamins: Secondary | ICD-10-CM | POA: Insufficient documentation

## 2019-04-24 DIAGNOSIS — M549 Dorsalgia, unspecified: Secondary | ICD-10-CM | POA: Insufficient documentation

## 2019-04-24 DIAGNOSIS — I129 Hypertensive chronic kidney disease with stage 1 through stage 4 chronic kidney disease, or unspecified chronic kidney disease: Secondary | ICD-10-CM | POA: Diagnosis not present

## 2019-04-24 DIAGNOSIS — Z85828 Personal history of other malignant neoplasm of skin: Secondary | ICD-10-CM | POA: Diagnosis not present

## 2019-04-24 DIAGNOSIS — D696 Thrombocytopenia, unspecified: Secondary | ICD-10-CM

## 2019-04-24 DIAGNOSIS — E1122 Type 2 diabetes mellitus with diabetic chronic kidney disease: Secondary | ICD-10-CM | POA: Diagnosis not present

## 2019-04-24 DIAGNOSIS — Z7982 Long term (current) use of aspirin: Secondary | ICD-10-CM | POA: Diagnosis not present

## 2019-04-24 DIAGNOSIS — Z7984 Long term (current) use of oral hypoglycemic drugs: Secondary | ICD-10-CM | POA: Diagnosis not present

## 2019-04-24 LAB — CBC WITH DIFFERENTIAL/PLATELET
Abs Immature Granulocytes: 0.02 10*3/uL (ref 0.00–0.07)
Basophils Absolute: 0.1 10*3/uL (ref 0.0–0.1)
Basophils Relative: 1 %
Eosinophils Absolute: 0.2 10*3/uL (ref 0.0–0.5)
Eosinophils Relative: 3 %
HCT: 48.1 % (ref 39.0–52.0)
Hemoglobin: 16.6 g/dL (ref 13.0–17.0)
Immature Granulocytes: 0 %
Lymphocytes Relative: 15 %
Lymphs Abs: 0.8 10*3/uL (ref 0.7–4.0)
MCH: 32.5 pg (ref 26.0–34.0)
MCHC: 34.5 g/dL (ref 30.0–36.0)
MCV: 94.1 fL (ref 80.0–100.0)
Monocytes Absolute: 0.5 10*3/uL (ref 0.1–1.0)
Monocytes Relative: 9 %
Neutro Abs: 3.6 10*3/uL (ref 1.7–7.7)
Neutrophils Relative %: 72 %
Platelets: 143 10*3/uL — ABNORMAL LOW (ref 150–400)
RBC: 5.11 MIL/uL (ref 4.22–5.81)
RDW: 12.6 % (ref 11.5–15.5)
WBC: 5.1 10*3/uL (ref 4.0–10.5)
nRBC: 0 % (ref 0.0–0.2)

## 2019-04-24 LAB — FERRITIN: Ferritin: 42 ng/mL (ref 24–336)

## 2019-04-24 NOTE — Progress Notes (Signed)
Patient here for follow up. Denies any concerns.  

## 2019-04-24 NOTE — Patient Instructions (Signed)

## 2019-07-17 ENCOUNTER — Inpatient Hospital Stay: Payer: Medicare Other

## 2019-07-17 ENCOUNTER — Other Ambulatory Visit: Payer: Self-pay

## 2019-07-17 ENCOUNTER — Inpatient Hospital Stay: Payer: Medicare Other | Attending: Hematology and Oncology

## 2019-07-17 VITALS — BP 145/83 | HR 67 | Temp 96.1°F | Resp 16

## 2019-07-17 DIAGNOSIS — D751 Secondary polycythemia: Secondary | ICD-10-CM

## 2019-07-17 DIAGNOSIS — E538 Deficiency of other specified B group vitamins: Secondary | ICD-10-CM | POA: Diagnosis not present

## 2019-07-17 DIAGNOSIS — D696 Thrombocytopenia, unspecified: Secondary | ICD-10-CM

## 2019-07-17 LAB — CBC
HCT: 49 % (ref 39.0–52.0)
Hemoglobin: 16.6 g/dL (ref 13.0–17.0)
MCH: 32.3 pg (ref 26.0–34.0)
MCHC: 33.9 g/dL (ref 30.0–36.0)
MCV: 95.3 fL (ref 80.0–100.0)
Platelets: 138 10*3/uL — ABNORMAL LOW (ref 150–400)
RBC: 5.14 MIL/uL (ref 4.22–5.81)
RDW: 12.8 % (ref 11.5–15.5)
WBC: 4.2 10*3/uL (ref 4.0–10.5)
nRBC: 0 % (ref 0.0–0.2)

## 2019-07-17 LAB — VITAMIN B12: Vitamin B-12: 1197 pg/mL — ABNORMAL HIGH (ref 180–914)

## 2019-07-17 LAB — FOLATE: Folate: 14.5 ng/mL (ref >5.9)

## 2019-07-17 NOTE — Patient Instructions (Signed)

## 2019-10-09 ENCOUNTER — Inpatient Hospital Stay: Payer: Medicare Other

## 2019-10-21 ENCOUNTER — Inpatient Hospital Stay: Payer: Medicare Other | Attending: Hematology and Oncology

## 2019-10-21 ENCOUNTER — Inpatient Hospital Stay: Payer: Medicare Other

## 2019-10-21 DIAGNOSIS — E538 Deficiency of other specified B group vitamins: Secondary | ICD-10-CM | POA: Insufficient documentation

## 2019-10-21 DIAGNOSIS — D751 Secondary polycythemia: Secondary | ICD-10-CM | POA: Insufficient documentation

## 2019-10-21 DIAGNOSIS — D696 Thrombocytopenia, unspecified: Secondary | ICD-10-CM | POA: Insufficient documentation

## 2019-10-22 ENCOUNTER — Inpatient Hospital Stay: Payer: Medicare Other

## 2019-10-22 ENCOUNTER — Other Ambulatory Visit: Payer: Self-pay

## 2019-10-22 VITALS — BP 147/82 | HR 65 | Temp 97.5°F | Resp 18

## 2019-10-22 DIAGNOSIS — D751 Secondary polycythemia: Secondary | ICD-10-CM

## 2019-10-22 DIAGNOSIS — E538 Deficiency of other specified B group vitamins: Secondary | ICD-10-CM | POA: Diagnosis not present

## 2019-10-22 DIAGNOSIS — D696 Thrombocytopenia, unspecified: Secondary | ICD-10-CM

## 2019-10-22 LAB — CBC
HCT: 47.6 % (ref 39.0–52.0)
Hemoglobin: 16.2 g/dL (ref 13.0–17.0)
MCH: 32 pg (ref 26.0–34.0)
MCHC: 34 g/dL (ref 30.0–36.0)
MCV: 94.1 fL (ref 80.0–100.0)
Platelets: 152 10*3/uL (ref 150–400)
RBC: 5.06 MIL/uL (ref 4.22–5.81)
RDW: 13 % (ref 11.5–15.5)
WBC: 5.6 10*3/uL (ref 4.0–10.5)
nRBC: 0 % (ref 0.0–0.2)

## 2019-10-22 NOTE — Patient Instructions (Signed)

## 2020-01-01 ENCOUNTER — Inpatient Hospital Stay: Payer: Medicare Other

## 2020-01-20 ENCOUNTER — Inpatient Hospital Stay: Payer: Medicare Other | Attending: Hematology and Oncology

## 2020-01-20 ENCOUNTER — Inpatient Hospital Stay: Payer: Medicare Other | Admitting: Nurse Practitioner

## 2020-01-20 ENCOUNTER — Inpatient Hospital Stay: Payer: Medicare Other

## 2020-01-20 ENCOUNTER — Encounter: Payer: Self-pay | Admitting: Nurse Practitioner

## 2020-01-20 ENCOUNTER — Other Ambulatory Visit: Payer: Self-pay

## 2020-01-20 VITALS — BP 123/74 | HR 64 | Temp 97.6°F | Resp 18 | Ht 65.0 in | Wt 179.6 lb

## 2020-01-20 DIAGNOSIS — D751 Secondary polycythemia: Secondary | ICD-10-CM

## 2020-01-20 DIAGNOSIS — E538 Deficiency of other specified B group vitamins: Secondary | ICD-10-CM | POA: Diagnosis not present

## 2020-01-20 DIAGNOSIS — D696 Thrombocytopenia, unspecified: Secondary | ICD-10-CM | POA: Diagnosis not present

## 2020-01-20 DIAGNOSIS — D538 Other specified nutritional anemias: Secondary | ICD-10-CM | POA: Insufficient documentation

## 2020-01-20 LAB — CBC
HCT: 48.9 % (ref 39.0–52.0)
Hemoglobin: 16.6 g/dL (ref 13.0–17.0)
MCH: 31.9 pg (ref 26.0–34.0)
MCHC: 33.9 g/dL (ref 30.0–36.0)
MCV: 94 fL (ref 80.0–100.0)
Platelets: 148 10*3/uL — ABNORMAL LOW (ref 150–400)
RBC: 5.2 MIL/uL (ref 4.22–5.81)
RDW: 12.7 % (ref 11.5–15.5)
WBC: 4.8 10*3/uL (ref 4.0–10.5)
nRBC: 0 % (ref 0.0–0.2)

## 2020-01-20 LAB — FERRITIN: Ferritin: 25 ng/mL (ref 24–336)

## 2020-01-20 NOTE — Progress Notes (Signed)
[facility]  [address] [place], [place] [address] Phone: [phone]  Fax: [phone]   Clinic Day:  01/20/2020  Referring physician: Dion Body, MD  Chief Complaint: James Watson is a 79 y.o. male with a history of chronic thrombocytopenia, renal insufficiency, and mild erythrocytosis who is seen for 6 month assessment.   HPI: Patient was last seen by Dr. Mike Gip on 04/24/2019.  Platelet count was 143,000, he continues oral B12 1000 mcg daily.  Denies abnormal bleeding or bruising.  He has required small volume phlebotomies to maintain hemoglobin less than 16.5.  Today, he says that he feels fine.  Says he does not feel dramatically better or worse after phlebotomy.   Past Medical History:  Diagnosis Date  . Adenomatous colon polyp 06/18/2015  . Basal cell carcinoma   . BCC (basal cell carcinoma)    followed by Dr. Evorn Gong   . Chronic renal insufficiency   . Diabetes mellitus without complication ([place])   . Hypercholesteremia   . Hypertension   . Osteoarthritis   . Prostate cancer ([place])   . Squamous cell carcinoma   . TIA (transient ischemic attack)     Past Surgical History:  Procedure Laterality Date  . CATARACT EXTRACTION W/PHACO Left 01/30/2019   Procedure: CATARACT EXTRACTION PHACO AND INTRAOCULAR LENS PLACEMENT ([place]) left diabetic  panoptix lens  00:59.4  15.3  9.09;  Surgeon: Leandrew Koyanagi, MD;  Location: [place];  Service: Ophthalmology;  Laterality: Left;  Diabetic - oral meds  . CATARACT EXTRACTION W/PHACO Right 02/20/2019   Procedure: CATARACT EXTRACTION PHACO AND INTRAOCULAR LENS PLACEMENT ([facility]) RIGHT DIABETIC PANOPTIX TORIC  0 ;54 11.0% 6.01;  Surgeon: Leandrew Koyanagi, MD;  Location: [place];  Service: Ophthalmology;  Laterality: Right;  . COLONOSCOPY WITH PROPOFOL N/A 07/16/2015   Procedure: COLONOSCOPY WITH PROPOFOL;  Surgeon: Manya Silvas, MD;  Location: [facility] ENDOSCOPY;   Service: Endoscopy;  Laterality: N/A;  . left shoulder arthroscopy    . mtp repair    . PROSTATECTOMY    . tm repair      History reviewed. No pertinent family history.  Social History:  reports that he has never smoked. He has never used smokeless tobacco. He reports current alcohol use of about 2.0 standard drinks of alcohol per week. No history on file for drug use. 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 He travels frequently.[name] He lives in [place].  He moved to [place].  Allergies:  Allergies  Allergen Reactions  . Enalapril Maleate Other (See Comments)    Difficulty breathing  . Hydrocodone Other (See Comments)  . Hydrocodone-Acetaminophen Nausea Only    Hallucinations    Current Medications: Current Outpatient Medications  Medication Sig Dispense Refill  . acetaminophen (TYLENOL) 650 MG CR tablet Take 1,300 mg by mouth daily as needed for pain.    [name] amLODipine (NORVASC) 10 MG tablet Take 10 mg by mouth daily.     [name] aspirin EC 81 MG tablet Take 81 mg by mouth daily.     [name] atorvastatin (LIPITOR) 80 MG tablet TAKE 1/2 TABLET BY MOUTH EVERY NIGHT  1  . Blood Glucose Monitoring Suppl (ONE TOUCH ULTRA 2) W/DEVICE KIT USE AS DIRECTED. DX: E11.22, N18.2  0  . Cholecalciferol (VITAMIN D-1000 MAX ST) 1000 UNITS tablet Take 2,000 Units by mouth daily.     . Coenzyme Q10 (  CO Q-10) 100 MG CAPS Take 1 capsule by mouth daily.     [name] glimepiride (AMARYL) 1 MG tablet TAKE 1 TABLET (1 MG TOTAL) BY MOUTH DAILY WITH BREAKFAST.  3  . hydrochlorothiazide (HYDRODIURIL) 12.5 MG tablet Take 12.5 mg by mouth daily.    . hydroxychloroquine (PLAQUENIL) 200 MG tablet Take 100 mg by mouth daily.     [name] losartan (COZAAR) 100 MG tablet TAKE 1 TABLET (100 MG TOTAL) BY MOUTH ONCE DAILY.  1  . ONE TOUCH ULTRA TEST test strip USE AS DIRECTED. CHECK CBG'S FASTING ONCE DAILY. DX: E11.22, N18.2  11    . ONETOUCH DELICA LANCETS FINE MISC Use 1 Units as directed. Check CBG's fasting once daily. Dx: E11.22, N18.2    . Turmeric 400 MG CAPS Take 1 capsule by mouth 2 (two) times a day.     . vitamin B-12 (CYANOCOBALAMIN) 1000 MCG tablet Take 1,000 mcg by mouth daily.     No current facility-administered medications for this visit.    Review of Systems  Constitutional: Negative.  Negative for chills, diaphoresis, fever, malaise/fatigue and weight loss.  HENT: Negative.  Negative for congestion, ear pain, hearing loss, nosebleeds, sinus pain and sore throat.   Eyes: Negative.  Negative for blurred vision, double vision, photophobia and pain.  Respiratory: Negative.  Negative for cough, sputum production, shortness of breath and wheezing.   Cardiovascular: Negative.  Negative for chest pain, palpitations, leg swelling and PND.  Gastrointestinal: Negative.  Negative for abdominal pain, blood in stool, constipation, diarrhea, heartburn, melena, nausea and vomiting.  Genitourinary: Negative.  Negative for dysuria, frequency, hematuria and urgency.  Musculoskeletal: Negative for back pain (chronic), joint pain (chronic arthritis in left hip, knees) and myalgias.  Skin: Negative.  Negative for itching and rash.  Neurological: Negative for dizziness, sensory change, speech change, focal weakness, weakness and headaches. Loss of consciousness:   Endo/Heme/Allergies: Does not bruise/bleed easily.       Diabetes.  Psychiatric/Behavioral: Negative.  Negative for depression and memory loss. The patient is not nervous/anxious and does not have insomnia.   All other systems reviewed and are negative.  Performance status (ECOG): 1  Vitals Blood pressure 123/74, pulse 64, temperature 97.6 F (36.4 C), temperature source Tympanic, resp. rate 18, height 5\' 5"  (1.651 m), weight 179 lb 9 oz (81.4 kg), SpO2 97 %.   Physical Exam Vitals and nursing note reviewed.  Constitutional:      General: He is not in  acute distress.    Appearance: He is well-developed. He is not diaphoretic.  HENT:     Head: Normocephalic and atraumatic.     Mouth/Throat:     Pharynx: No oropharyngeal exudate.  Eyes:     General: No scleral icterus.    Conjunctiva/sclera: Conjunctivae normal.     Pupils: Pupils are equal, round, and reactive to light.     Comments: Glasses.  Blue eyes.  Neck:     Vascular: No JVD.  Cardiovascular:     Rate and Rhythm: Normal rate and regular rhythm.     Heart sounds: Normal heart sounds. No murmur heard.  No gallop.   Pulmonary:     Effort: Pulmonary effort is normal. No respiratory distress.     Breath sounds: Normal breath sounds. No wheezing or rales.  Abdominal:     General: Bowel sounds are normal. There is no distension.     Palpations: Abdomen is soft. There is no mass.     Tenderness: There is  no abdominal tenderness. There is no guarding or rebound.  Musculoskeletal:        General: Normal range of motion.     Cervical back: Normal range of motion and neck supple.  Lymphadenopathy:     Head:     Right side of head: No preauricular, posterior auricular or occipital adenopathy.     Left side of head: No preauricular, posterior auricular or occipital adenopathy.     Cervical: No cervical adenopathy.     Upper Body:     Right upper body: No supraclavicular adenopathy.     Left upper body: No supraclavicular adenopathy.     Lower Body: No right inguinal adenopathy. No left inguinal adenopathy.  Skin:    General: Skin is warm and dry.     Coloration: Skin is not pale.     Findings: No erythema or rash.  Neurological:     Mental Status: He is alert and oriented to person, place, and time.  Psychiatric:        Behavior: Behavior normal.        Thought Content: Thought content normal.        Judgment: Judgment normal.     Appointment on 01/20/2020  Component Date Value Ref Range Status  . WBC 01/20/2020 4.8  4.0 - 10.5 K/uL Final  . RBC 01/20/2020 5.20  4.22 -  5.81 MIL/uL Final  . Hemoglobin 01/20/2020 16.6  13.0 - 17.0 g/dL Final  . HCT 01/20/2020 48.9  39 - 52 % Final  . MCV 01/20/2020 94.0  80.0 - 100.0 fL Final  . MCH 01/20/2020 31.9  26.0 - 34.0 pg Final  . MCHC 01/20/2020 33.9  30.0 - 36.0 g/dL Final  . RDW 01/20/2020 12.7  11.5 - 15.5 % Final  . Platelets 01/20/2020 148* 150 - 400 K/uL Final  . nRBC 01/20/2020 0.0  0.0 - 0.2 % Final   Performed at [facility], [address] [place], [place] [address]    Assessment:  LAWERENCE DERY Watson is a 79 y.o. male withmild thrombocytopeniadating back to at least 2013/2015 with no significant change over the years. Peripheral smear on 04/06/2016 revealed mild thrombocytopenia with no abnormal or immature cells. ANA, lupus anticoagulant, SPEP, hepatitis C antibody were negative. 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 Because of platelet count stability, he deferred bone marrow biopsy.  Platelet counthas been followed: 126,000 on 09/11/2012, 139,000 on 04/06/2016, 141,000 on 04/20/2016, 154,000 on 08/18/2016, 143,000 on 04/18/2017, 135,000 on 05/03/2017, 153,000 on 04/17/2018, 165,000 on 05/08/2018, 154,000 on 06/11/2018, 143,000 on 07/17/2018, 157,000 on 09/21/2018, and 166,000 on 10/19/2018.  Work-up on 04/18/2017 revealed a hematocrit of 48.4, hemoglobin 16.4, MCV 95, platelets 143,000, WBC 3300 with an ANC of 2300. ALC was 600. Normal studies included: LFTs, folate (16.7), B12 (473), TSH, H pylori IgG, and hepatitis B core antibody total. Copper level was 94 (72-166) on 05/03/2017.   Work-up on 11/12/2019revealed a hematocrit of 51.1, hemoglobin 17.1, MCV 94.5, platelets 153,000, WBC 4300 with an ANC of 2900. Creatininewas 1.72 (CrCl 36 ml/min). B12was 227 (low). Normal studiesincluded: LFTs, folate (11.6), SPEP, epo level (12.8), carbon monoxide level (2.6), testosterone (257), JAK2, exon 12-15, CALR, and MPL. Kappa free light chains were 25.2,  lambda free light chains 11.0 with a ratio of 2.29 (0.26 - 1.65). 24 hour UPEPrevealed no monoclonal protein. There was 406 mg protein/24 hours. 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 Ferritin, iron studies and BCR-ABL were normal on  05/08/2018.  He has ahistory of B12 deficiency. B12level was 248 (low normal) on 04/06/2016. MMAwas 428 (high) on 04/20/2016 and 462 (high) on 04/18/2017 poissibly due to renal insufficiency. He took B12 IM for 9 months. He went out of town and the injections stopped. Anti-parietal antibody and intrinsic factor antibody were negative on 05/03/2018. He began oral B12on 05/08/2018. B12 was 442 on 06/11/2018.  He has a history of adenomatous colonic polyps. Last colonoscopy was in 07/2015. He has a history of prostate cancertreated with prostatectomy.   He has chronic kidney disease. Creatinine was 1.81 on 04/18/2017 and 1.72 on 04/07/2018. Calcium, albumen, and protein were normal. Myeloma panel on 04/06/2016 revealed no monoclonal protein and normal immunoglobulins.SPEP on 04/17/2018 revealed no monoclonal protein.IFE urineon 04/19/2017 revealed 618 mg protein in 24 hours (30-150). Free light chain excretion rate was 15.5 mg/L (normal), lambda free light chains 0.76 mg/L (normal) with a free kappa/lambda ratio 20.39 (2.04-10.37). 24 hour UPEP revealed no monoclonal protein. There was 406 mg protein/24 hours. 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  He has mild polycythemia. Hemoglobin was 16.6 on 05/03/2018. He denies any cardiopulmonary disease, tobacco use, sleep apnea or testosterone use. CXRon 05/08/2018 revealed no acute cardiopulmonary abnormalities.  Symptomatically, he is doing well and denies specific complaints.  Exam is unremarkable.  Plan: 1.   Labs today were reviewed with patient: CBC with diff, ferritin. 2. Mild thrombocytopenia Platelet-148,000 stable   Asymptomatic  Continue to monitor  counts  3. B12 deficiency History of B12 deficiency  Was last checked in February 2021 and was 1197 at that time.  No evidence of pernicious anemia-intrinsic factor and antiparietal cell antibodies were negative in November 2019  Continue oral B12  Plan to check B12 and folate in February 2022  4. Mild erythrocytosis Hemoglobin 16.6.   Normal studies included: Epo level, carbon monoxide level, testosterone, and JAK2 with reflex. Continue small volume phlebotomy (300 cc) to maintain hemoglobin < 16.5.  Phlebotomy today. Disposition Return to clinic in 3 months for cbc and +/- phlebotomy  RTC in 6 months for labs (CBC, B12, Folate) and +/- phlebotomy RTC in 9 months for MD assessment, labs (CBC with diff, ferritin), and +/- phlebotomy.    I discussed the assessment and treatment plan with the patient.  The patient was provided an opportunity to ask questions and all were answered.  The patient agreed with the plan and demonstrated an understanding of the instructions.  The patient was advised to call back if the symptoms worsen or if the condition fails to improve as anticipated.  Beckey Rutter, DNP, AGNP-C [place] [facility] [phone] (clinic)

## 2020-03-22 IMAGING — MR MR HIP*L* W/O CM
5 series · 40 of 40 positions shown · non-contrast
Comparison: None.

CLINICAL DATA: Chronic left hip pain.

EXAM:
MR OF THE LEFT HIP WITHOUT CONTRAST
TECHNIQUE: Multiplanar, multisequence MR imaging was performed. No intravenous
contrast was administered.

[Series 8: T1 · coronal · left · 4.0mm · 1.19mm/px · 10 of 39 slices shown]
[im 1/39]
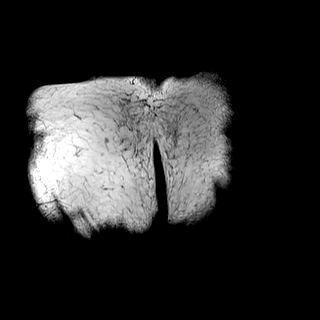
[im 5/39]
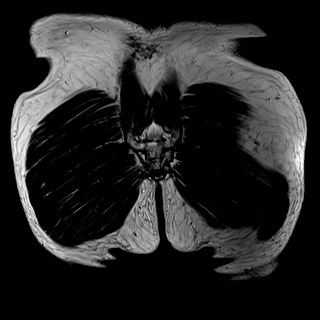
[im 9/39]
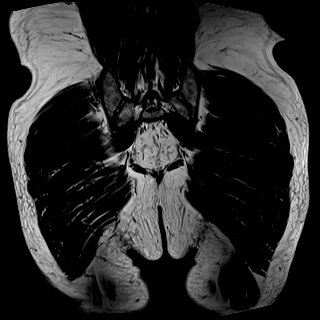
[im 13/39]
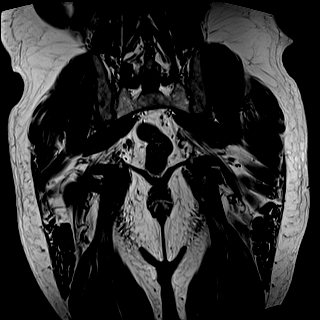
[im 17/39]
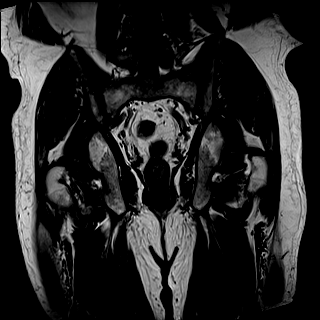
[im 22/39]
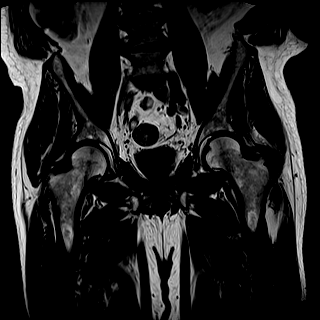
[im 26/39]
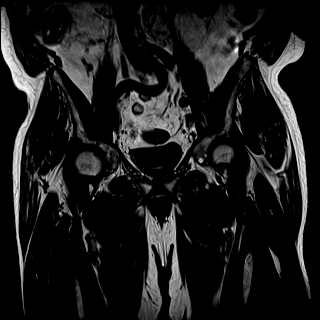
[im 30/39]
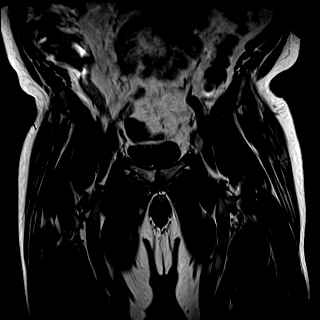
[im 34/39]
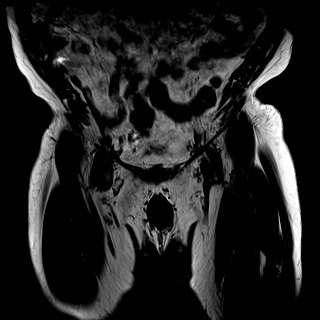
[im 39/39]
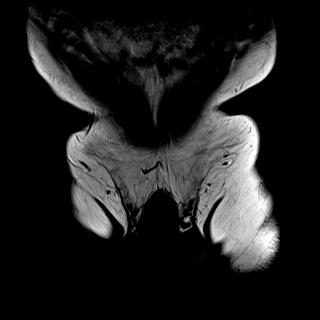

[Series 9: T2 fat-sat · coronal · left · 4.0mm · 1.19mm/px · 9 of 39 slices shown (1 of 2)]
[im 1/39]
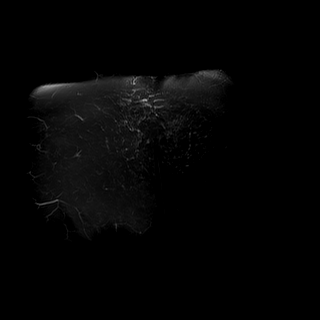
[im 5/39]
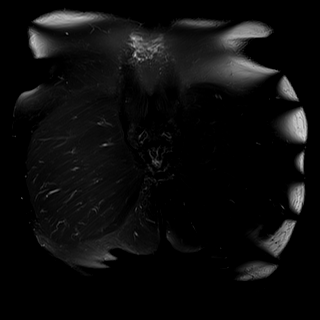
[im 10/39]
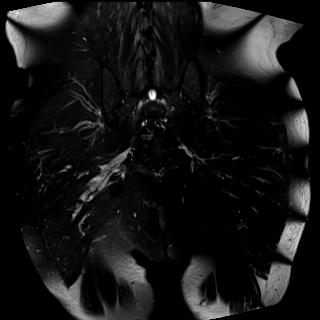
[im 15/39]
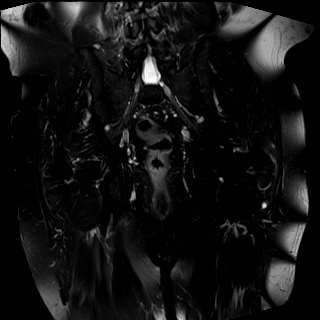
[im 20/39]
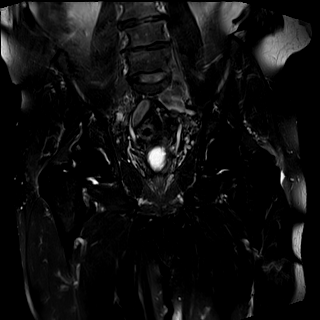
[im 24/39]
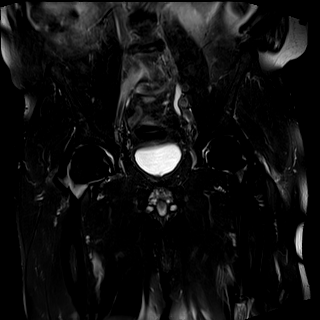
[im 29/39]
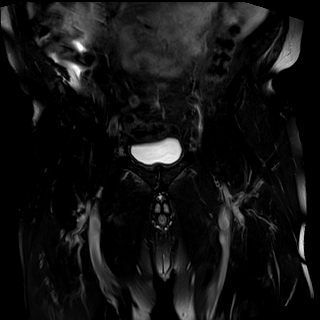
[im 34/39]
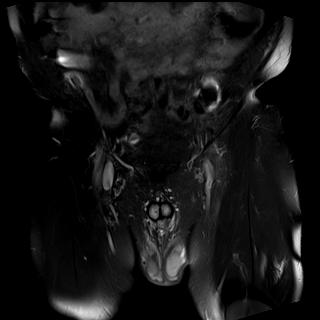
[im 39/39]
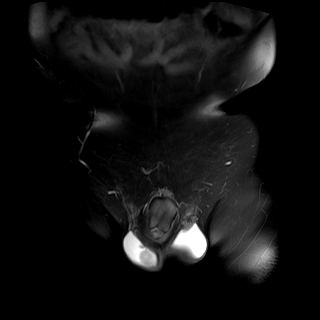

[Series 10: T2 fat-sat · axial · left · 4.0mm · 0.35mm/px · z∈[-59,+86]mm · 7 of 30 slices shown (2 of 2)]
[im 1/30]
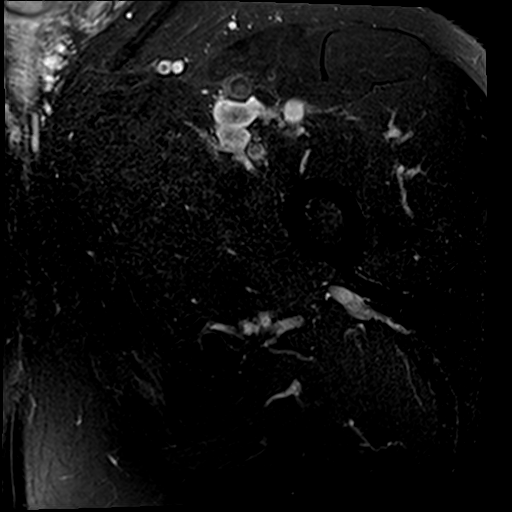
[im 5/30]
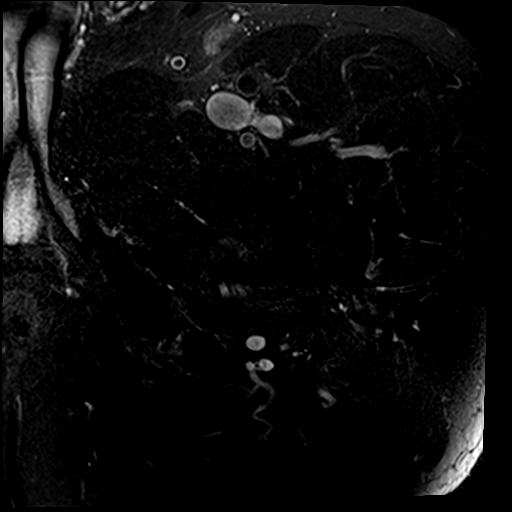
[im 10/30]
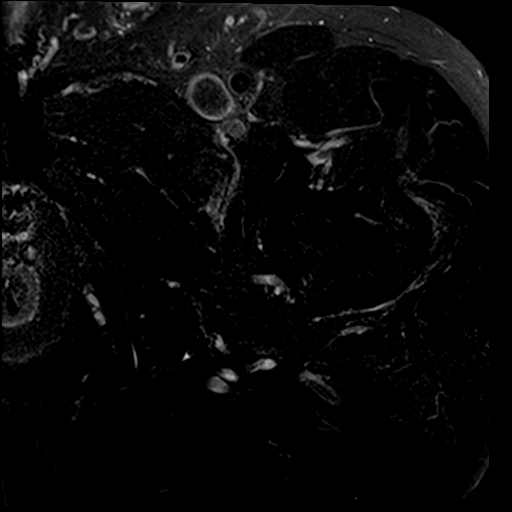
[im 15/30]
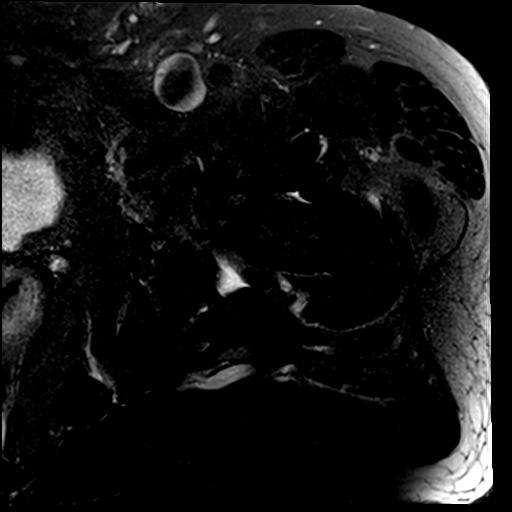
[im 20/30]
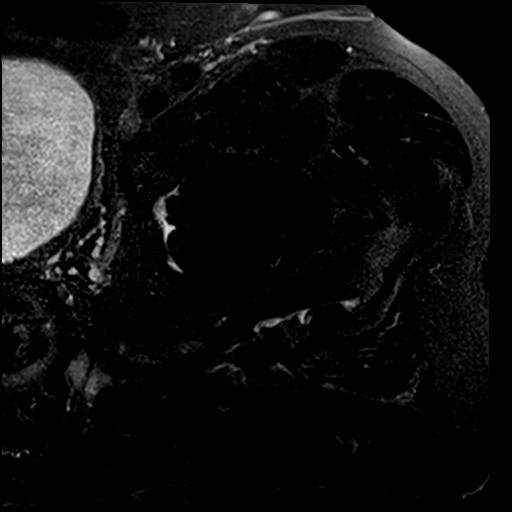
[im 25/30]
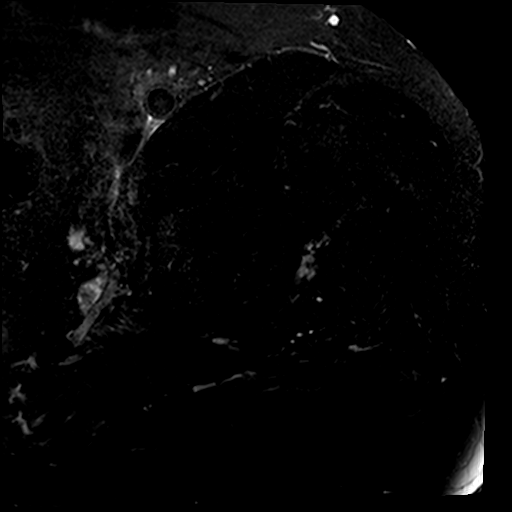
[im 30/30]
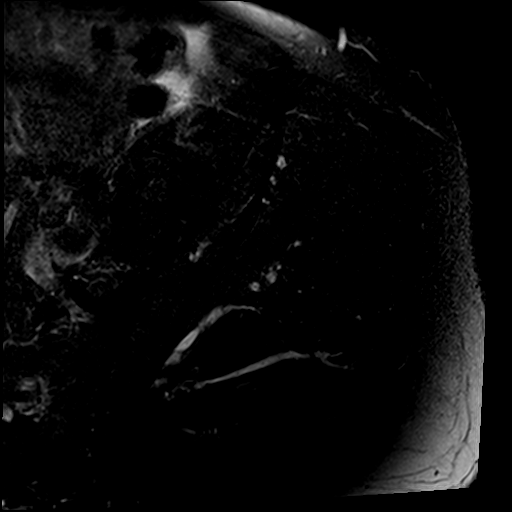

[Series 11: PD fat-sat · sagittal · left · 4.0mm · 0.70mm/px · 7 of 29 slices shown (1 of 2)]
[im 1/29]
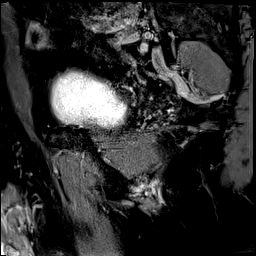
[im 5/29]
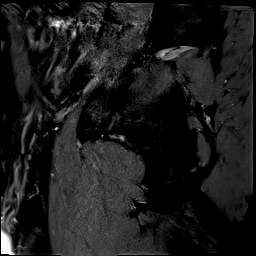
[im 10/29]
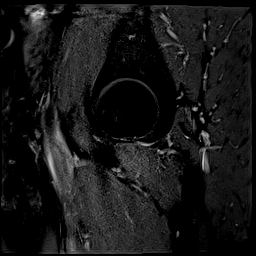
[im 15/29]
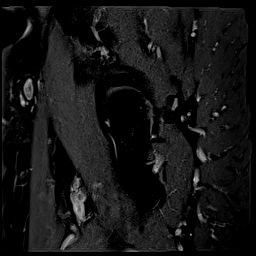
[im 19/29]
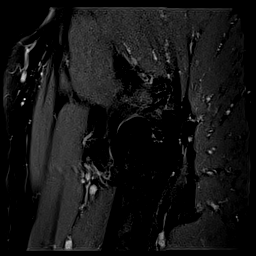
[im 24/29]
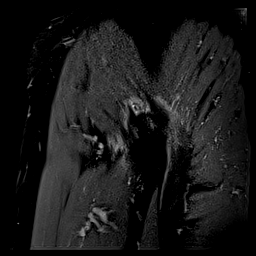
[im 29/29]
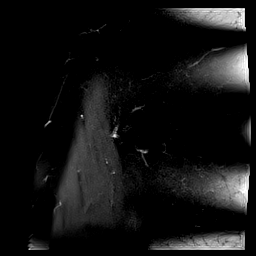

[Series 12: PD fat-sat · coronal · left · 4.0mm · 0.70mm/px · 7 of 29 slices shown (2 of 2)]
[im 1/29]
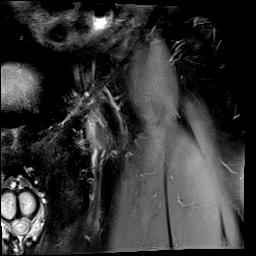
[im 5/29]
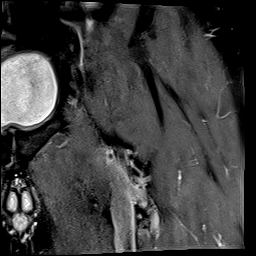
[im 10/29]
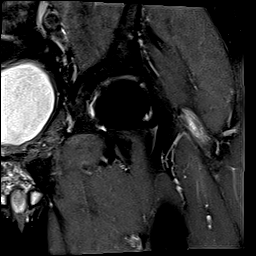
[im 15/29]
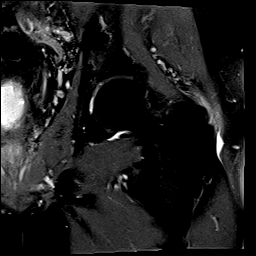
[im 19/29]
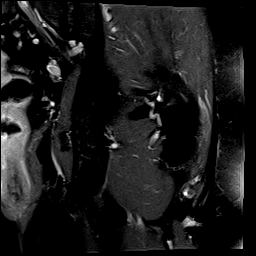
[im 24/29]
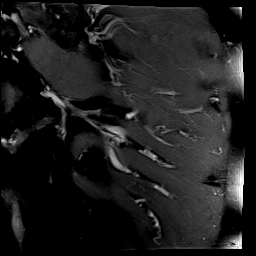
[im 29/29]
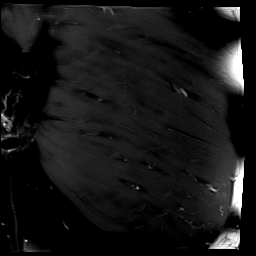

[40 of 40 positions shown; findings below may reference images not displayed]

FINDINGS: Bones: There is no evidence of acute fracture, dislocation or
avascular necrosis. Mild degenerative changes of the pubic
symphysis. The sacroiliac joints are unremarkable. No focal bone
lesion.

Articular cartilage and labrum

Articular cartilage: No focal chondral defect or subchondral signal
abnormality identified.

Labrum: Tear of the left anterior superior labrum. Tear of the right
superior labrum.

Joint or bursal effusion

Joint effusion: No significant hip joint effusion.

Bursae: Trace fluid in the left greater trochanteric bursa.

Muscles and tendons

Muscles and tendons: The visualized gluteus, hamstring and iliopsoas
tendons appear normal. No muscle edema or atrophy.

Other findings

Miscellaneous: The visualized internal pelvic contents appear
unremarkable. Moderate bilateral hydroceles.
IMPRESSION: 1. No acute osseous abnormality or significant degenerative changes.
No evidence of avascular necrosis.
2. Left anterior superior labral tear.
3. Right superior labral tear.
4. Minimal left greater trochanteric bursitis.
5. Moderate bilateral hydroceles.

## 2020-03-24 ENCOUNTER — Other Ambulatory Visit: Payer: Medicare Other

## 2020-04-02 ENCOUNTER — Other Ambulatory Visit: Payer: Medicare Other

## 2020-04-02 ENCOUNTER — Ambulatory Visit: Payer: Medicare Other | Admitting: Hematology and Oncology

## 2020-04-21 ENCOUNTER — Inpatient Hospital Stay: Payer: Medicare Other

## 2020-04-23 ENCOUNTER — Other Ambulatory Visit: Payer: Self-pay

## 2020-04-23 DIAGNOSIS — D751 Secondary polycythemia: Secondary | ICD-10-CM

## 2020-04-23 NOTE — Addendum Note (Signed)
Addended by: Ruthell Rummage A on: 04/23/2020 01:28 PM   Modules accepted: Orders

## 2020-04-27 ENCOUNTER — Inpatient Hospital Stay: Payer: Medicare Other | Attending: Hematology and Oncology

## 2020-05-11 ENCOUNTER — Other Ambulatory Visit: Payer: Self-pay

## 2020-05-11 ENCOUNTER — Inpatient Hospital Stay: Payer: Medicare Other | Attending: Hematology and Oncology

## 2020-05-11 ENCOUNTER — Inpatient Hospital Stay: Payer: Medicare Other

## 2020-05-11 DIAGNOSIS — D751 Secondary polycythemia: Secondary | ICD-10-CM | POA: Diagnosis present

## 2020-05-11 DIAGNOSIS — N189 Chronic kidney disease, unspecified: Secondary | ICD-10-CM | POA: Diagnosis not present

## 2020-05-11 LAB — CBC WITH DIFFERENTIAL/PLATELET
Abs Immature Granulocytes: 0.03 10*3/uL (ref 0.00–0.07)
Basophils Absolute: 0.1 10*3/uL (ref 0.0–0.1)
Basophils Relative: 1 %
Eosinophils Absolute: 0.1 10*3/uL (ref 0.0–0.5)
Eosinophils Relative: 2 %
HCT: 46.3 % (ref 39.0–52.0)
Hemoglobin: 15.9 g/dL (ref 13.0–17.0)
Immature Granulocytes: 0 %
Lymphocytes Relative: 9 %
Lymphs Abs: 0.7 10*3/uL (ref 0.7–4.0)
MCH: 31.9 pg (ref 26.0–34.0)
MCHC: 34.3 g/dL (ref 30.0–36.0)
MCV: 92.8 fL (ref 80.0–100.0)
Monocytes Absolute: 0.6 10*3/uL (ref 0.1–1.0)
Monocytes Relative: 7 %
Neutro Abs: 6.3 10*3/uL (ref 1.7–7.7)
Neutrophils Relative %: 81 %
Platelets: 148 10*3/uL — ABNORMAL LOW (ref 150–400)
RBC: 4.99 MIL/uL (ref 4.22–5.81)
RDW: 13.1 % (ref 11.5–15.5)
WBC: 7.8 10*3/uL (ref 4.0–10.5)
nRBC: 0 % (ref 0.0–0.2)

## 2020-05-11 LAB — FERRITIN: Ferritin: 24 ng/mL (ref 24–336)

## 2020-05-11 LAB — FOLATE: Folate: 14.5 ng/mL (ref >5.9)

## 2020-05-11 NOTE — Progress Notes (Signed)
hgb 15.9 no phlebotomy today

## 2020-05-12 LAB — VITAMIN B12: Vitamin B-12: 1026 pg/mL — ABNORMAL HIGH (ref 180–914)

## 2020-07-17 ENCOUNTER — Other Ambulatory Visit: Payer: Self-pay

## 2020-07-17 DIAGNOSIS — E538 Deficiency of other specified B group vitamins: Secondary | ICD-10-CM

## 2020-07-22 ENCOUNTER — Other Ambulatory Visit: Payer: Self-pay

## 2020-07-22 ENCOUNTER — Inpatient Hospital Stay: Payer: Medicare Other | Attending: Hematology and Oncology

## 2020-07-22 ENCOUNTER — Inpatient Hospital Stay: Payer: Medicare Other

## 2020-07-22 VITALS — BP 109/75 | HR 66 | Temp 97.2°F | Resp 16

## 2020-07-22 DIAGNOSIS — D751 Secondary polycythemia: Secondary | ICD-10-CM | POA: Insufficient documentation

## 2020-07-22 DIAGNOSIS — N189 Chronic kidney disease, unspecified: Secondary | ICD-10-CM | POA: Insufficient documentation

## 2020-07-22 DIAGNOSIS — E538 Deficiency of other specified B group vitamins: Secondary | ICD-10-CM

## 2020-07-22 LAB — CBC
HCT: 49.2 % (ref 39.0–52.0)
Hemoglobin: 16.5 g/dL (ref 13.0–17.0)
MCH: 31.5 pg (ref 26.0–34.0)
MCHC: 33.5 g/dL (ref 30.0–36.0)
MCV: 93.9 fL (ref 80.0–100.0)
Platelets: 156 10*3/uL (ref 150–400)
RBC: 5.24 MIL/uL (ref 4.22–5.81)
RDW: 12.9 % (ref 11.5–15.5)
WBC: 4.8 10*3/uL (ref 4.0–10.5)
nRBC: 0 % (ref 0.0–0.2)

## 2020-07-22 NOTE — Progress Notes (Signed)
Patient received prescribed treatment in clinic. Phlebotomy 300 cc performed without difficulty.  Tolerated well. Patient stable at discharge.

## 2020-10-19 ENCOUNTER — Ambulatory Visit: Payer: Medicare Other | Admitting: Hematology and Oncology

## 2020-10-19 ENCOUNTER — Other Ambulatory Visit: Payer: Medicare Other

## 2020-11-04 ENCOUNTER — Other Ambulatory Visit: Payer: Self-pay

## 2020-11-04 DIAGNOSIS — D751 Secondary polycythemia: Secondary | ICD-10-CM

## 2020-11-05 ENCOUNTER — Other Ambulatory Visit: Payer: Self-pay

## 2020-11-05 ENCOUNTER — Inpatient Hospital Stay: Payer: Medicare Other | Attending: Hematology and Oncology | Admitting: Oncology

## 2020-11-05 ENCOUNTER — Encounter: Payer: Self-pay | Admitting: Oncology

## 2020-11-05 ENCOUNTER — Inpatient Hospital Stay: Payer: Medicare Other

## 2020-11-05 VITALS — BP 115/76 | HR 67 | Temp 97.7°F | Resp 16 | Wt 179.5 lb

## 2020-11-05 DIAGNOSIS — N1832 Chronic kidney disease, stage 3b: Secondary | ICD-10-CM

## 2020-11-05 DIAGNOSIS — R803 Bence Jones proteinuria: Secondary | ICD-10-CM | POA: Insufficient documentation

## 2020-11-05 DIAGNOSIS — D696 Thrombocytopenia, unspecified: Secondary | ICD-10-CM | POA: Insufficient documentation

## 2020-11-05 DIAGNOSIS — Z9079 Acquired absence of other genital organ(s): Secondary | ICD-10-CM | POA: Diagnosis not present

## 2020-11-05 DIAGNOSIS — N189 Chronic kidney disease, unspecified: Secondary | ICD-10-CM | POA: Diagnosis not present

## 2020-11-05 DIAGNOSIS — Z85828 Personal history of other malignant neoplasm of skin: Secondary | ICD-10-CM | POA: Diagnosis not present

## 2020-11-05 DIAGNOSIS — Z86010 Personal history of colonic polyps: Secondary | ICD-10-CM | POA: Insufficient documentation

## 2020-11-05 DIAGNOSIS — E538 Deficiency of other specified B group vitamins: Secondary | ICD-10-CM | POA: Diagnosis not present

## 2020-11-05 DIAGNOSIS — Z8546 Personal history of malignant neoplasm of prostate: Secondary | ICD-10-CM | POA: Insufficient documentation

## 2020-11-05 DIAGNOSIS — D751 Secondary polycythemia: Secondary | ICD-10-CM | POA: Insufficient documentation

## 2020-11-05 LAB — CBC WITH DIFFERENTIAL/PLATELET
Abs Immature Granulocytes: 0.01 10*3/uL (ref 0.00–0.07)
Basophils Absolute: 0.1 10*3/uL (ref 0.0–0.1)
Basophils Relative: 1 %
Eosinophils Absolute: 0.2 10*3/uL (ref 0.0–0.5)
Eosinophils Relative: 4 %
HCT: 48.6 % (ref 39.0–52.0)
Hemoglobin: 16.2 g/dL (ref 13.0–17.0)
Immature Granulocytes: 0 %
Lymphocytes Relative: 19 %
Lymphs Abs: 0.9 10*3/uL (ref 0.7–4.0)
MCH: 31.2 pg (ref 26.0–34.0)
MCHC: 33.3 g/dL (ref 30.0–36.0)
MCV: 93.6 fL (ref 80.0–100.0)
Monocytes Absolute: 0.5 10*3/uL (ref 0.1–1.0)
Monocytes Relative: 10 %
Neutro Abs: 3.2 10*3/uL (ref 1.7–7.7)
Neutrophils Relative %: 66 %
Platelets: 173 10*3/uL (ref 150–400)
RBC: 5.19 MIL/uL (ref 4.22–5.81)
RDW: 13.1 % (ref 11.5–15.5)
WBC: 4.9 10*3/uL (ref 4.0–10.5)
nRBC: 0 % (ref 0.0–0.2)

## 2020-11-05 LAB — FERRITIN: Ferritin: 29 ng/mL (ref 24–336)

## 2020-11-05 NOTE — Progress Notes (Signed)
[facility]  [address], [address] [place], [place] [address] Phone: [phone]  Fax: [phone]   Clinic Day:  11/05/2020  Referring physician: Dion Body, MD  Chief Complaint: James Watson is a 80 y.o. male presents for follow up for chronic thrombocytopenia, renal insufficiency, and erythrocytosis   INTERVAL HISTORY James Watson is a 80 y.o. male who has above history reviewed by me today presents for follow up visit for erythrocytosis, thrombocytopenia. Problems and complaints are listed below: Patient reports feeling well.  he is a retired Pharmacist, community. Patient previously followed up by Dr.Corcoran, patient switched care to me on 11/07/20 Extensive medical record review was performed by me  No new complaints  He has chronic kidney disease. Myeloma panel on 04/06/2016 revealed no monoclonal protein and normal immunoglobulins.SPEP on 04/17/2018 revealed no monoclonal protein. Light chain ratio 2.29. IFE urineon 04/19/2017 revealed 618 mg protein in 24 hours (30-150). 24 hour UPEP revealed no monoclonal protein. There was 406 mg protein/24 hours. 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  He has history of erythrocytosis. Hemoglobin was 16.6 on 05/03/2018. He denies any cardiopulmonary disease, tobacco use, sleep apnea or testosterone use. CXRon 05/08/2018 revealed no acute cardiopulmonary abnormalities.  04/17/2018 patient has normal erythropoietin level 12.8, normal carbo monoxide level, JAK2 V617F mutation negative, with reflex to other mutations CALR, MPL, JAK 2 Ex 12-15 mutations negative. He was advised by Dr. Mike Gip to have 300 cc phlebotomy every 3 months if hemoglobin is above 16.5.   Review of Systems  Constitutional: Negative for appetite change, chills, fatigue, fever and unexpected weight change.  HENT:   Negative for hearing loss and voice change.   Eyes: Negative for eye problems and icterus.   Respiratory: Negative for chest tightness, cough and shortness of breath.   Cardiovascular: Negative for chest pain and leg swelling.  Gastrointestinal: Negative for abdominal distention and abdominal pain.  Endocrine: Negative for hot flashes.  Genitourinary: Negative for difficulty urinating, dysuria and frequency.   Musculoskeletal: Negative for arthralgias.  Skin: Negative for itching and rash.  Neurological: Negative for light-headedness and numbness.  Hematological: Negative for adenopathy. Does not bruise/bleed easily.  Psychiatric/Behavioral: Negative for confusion.    Past Medical History:  Diagnosis Date  . Adenomatous colon polyp 06/18/2015  . Basal cell carcinoma   . BCC (basal cell carcinoma)    followed by Dr. Evorn Gong   . Chronic renal insufficiency   . Diabetes mellitus without complication ([place])   . Hypercholesteremia   . Hypertension   . Osteoarthritis   . Prostate cancer ([place])   . Squamous cell carcinoma   . TIA (transient ischemic attack)     Past Surgical History:  Procedure Laterality Date  . CATARACT EXTRACTION W/PHACO Left 01/30/2019   Procedure: CATARACT EXTRACTION PHACO AND INTRAOCULAR LENS PLACEMENT ([place]) left diabetic  panoptix lens  00:59.4  15.3  9.09;  Surgeon: Leandrew Koyanagi, MD;  Location: [place];  Service: Ophthalmology;  Laterality: Left;  Diabetic - oral meds  . CATARACT EXTRACTION W/PHACO Right 02/20/2019   Procedure: CATARACT EXTRACTION PHACO AND INTRAOCULAR LENS PLACEMENT ([facility]) RIGHT DIABETIC PANOPTIX TORIC  0 ;54 11.0% 6.01;  Surgeon: Leandrew Koyanagi, MD;  Location: [place];  Service: Ophthalmology;  Laterality: Right;  . COLONOSCOPY WITH PROPOFOL N/A 07/16/2015   Procedure: COLONOSCOPY WITH PROPOFOL;  Surgeon: Manya Silvas, MD;  Location: [facility] ENDOSCOPY;  Service: Endoscopy;  Laterality: N/A;  . left shoulder arthroscopy    . mtp  repair    . PROSTATECTOMY    . tm repair      History reviewed. No  pertinent family history.  Social History:  reports that he has never smoked. He has never used smokeless tobacco. He reports current alcohol use of about 2.0 standard drinks of alcohol per week. No history on file for drug use. 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 He travels frequently. He lives in [place].  He lives in a retirement community.  The patient is alone today.  Allergies:  Allergies  Allergen Reactions  . Enalapril Maleate Other (See Comments)    Difficulty breathing  . Hydrocodone Other (See Comments)  . Hydrocodone-Acetaminophen Nausea Only    Hallucinations    Current Medications: Current Outpatient Medications  Medication Sig Dispense Refill  . acetaminophen (TYLENOL) 650 MG CR tablet Take 1,300 mg by mouth daily as needed for pain.    [name] amLODipine (NORVASC) 10 MG tablet Take 10 mg by mouth daily.     [name] aspirin EC 81 MG tablet Take 81 mg by mouth daily.     [name] atorvastatin (LIPITOR) 80 MG tablet TAKE 1/2 TABLET BY MOUTH EVERY NIGHT  1  . Blood Glucose Monitoring Suppl (ONE TOUCH ULTRA 2) W/DEVICE KIT USE AS DIRECTED. DX: E11.22, N18.2  0  . Cholecalciferol 25 MCG (1000 UT) tablet Take 2,000 Units by mouth daily.     . Coenzyme Q10 (CO Q-10) 100 MG CAPS Take 1 capsule by mouth daily.     [name] glimepiride (AMARYL) 1 MG tablet TAKE 1 TABLET (1 MG TOTAL) BY MOUTH DAILY WITH BREAKFAST.  3  . hydrochlorothiazide (HYDRODIURIL) 12.5 MG tablet Take 12.5 mg by mouth daily.    . hydroxychloroquine (PLAQUENIL) 200 MG tablet Take 100 mg by mouth daily.     [name] losartan (COZAAR) 50 MG tablet Take 1 tablet by mouth 2 (two) times daily.    . ONE TOUCH ULTRA TEST test strip USE AS DIRECTED. CHECK CBG'S FASTING ONCE DAILY. DX: E11.22, N18.2  11  . ONETOUCH DELICA LANCETS FINE MISC Use 1 Units as directed. Check CBG's fasting once daily. Dx: E11.22, N18.2    . Turmeric 400 MG CAPS Take 1 capsule by mouth 2 (two) times a day.     . vitamin B-12  (CYANOCOBALAMIN) 1000 MCG tablet Take 1,000 mcg by mouth daily.    [name] zolpidem (AMBIEN) 5 MG tablet Take one tab nightly prn for insomnia while traveling through different time zones (Patient not taking: Reported on 11/05/2020)     No current facility-administered medications for this visit.     Performance status (ECOG): 1  Vitals Blood pressure 115/76, pulse 67, temperature 97.7 F (36.5 C), resp. rate 16, weight 179 lb 7.3 oz (81.4 kg), SpO2 96 %.   Physical Exam Vitals and nursing note reviewed.  Constitutional:      General: He is not in acute distress.    Appearance: He is well-developed. He is not diaphoretic.  HENT:     Head: Normocephalic and atraumatic.     Mouth/Throat:     Pharynx: No oropharyngeal exudate.  Eyes:     General: No scleral icterus.    Pupils: Pupils are equal, round, and reactive to light.  Neck:     Vascular: No JVD.  Cardiovascular:     Rate and Rhythm: Normal rate and regular rhythm.     Heart sounds: Normal heart sounds. No murmur heard.   Pulmonary:  Effort: Pulmonary effort is normal. No respiratory distress.     Breath sounds: Normal breath sounds. No wheezing or rales.  Chest:  Breasts:     Right: No supraclavicular adenopathy.     Left: No supraclavicular adenopathy.    Abdominal:     General: Bowel sounds are normal. There is no distension.     Palpations: Abdomen is soft.     Tenderness: There is no abdominal tenderness.  Musculoskeletal:        General: Normal range of motion.     Cervical back: Normal range of motion and neck supple.  Lymphadenopathy:     Head:     Right side of head: No preauricular, posterior auricular or occipital adenopathy.     Left side of head: No preauricular, posterior auricular or occipital adenopathy.     Cervical: No cervical adenopathy.     Upper Body:     Right upper body: No supraclavicular adenopathy.     Left upper body: No supraclavicular adenopathy.     Lower Body: No right inguinal  adenopathy. No left inguinal adenopathy.  Skin:    General: Skin is warm and dry.     Coloration: Skin is not pale.     Findings: No erythema or rash.  Neurological:     Mental Status: He is alert and oriented to person, place, and time.  Psychiatric:        Mood and Affect: Mood normal.        Behavior: Behavior normal.     Appointment on 11/05/2020  Component Date Value Ref Range Status  . Ferritin 11/05/2020 29  24 - 336 ng/mL Final   Performed at [facility], [address] [place], [place] [address]  . WBC 11/05/2020 4.9  4.0 - 10.5 K/uL Final  . RBC 11/05/2020 5.19  4.22 - 5.81 MIL/uL Final  . Hemoglobin 11/05/2020 16.2  13.0 - 17.0 g/dL Final  . HCT 11/05/2020 48.6  39.0 - 52.0 % Final  . MCV 11/05/2020 93.6  80.0 - 100.0 fL Final  . MCH 11/05/2020 31.2  26.0 - 34.0 pg Final  . MCHC 11/05/2020 33.3  30.0 - 36.0 g/dL Final  . RDW 11/05/2020 13.1  11.5 - 15.5 % Final  . Platelets 11/05/2020 173  150 - 400 K/uL Final  . nRBC 11/05/2020 0.0  0.0 - 0.2 % Final  . Neutrophils Relative % 11/05/2020 66  % Final  . Neutro Abs 11/05/2020 3.2  1.7 - 7.7 K/uL Final  . Lymphocytes Relative 11/05/2020 19  % Final  . Lymphs Abs 11/05/2020 0.9  0.7 - 4.0 K/uL Final  . Monocytes Relative 11/05/2020 10  % Final  . Monocytes Absolute 11/05/2020 0.5  0.1 - 1.0 K/uL Final  . Eosinophils Relative 11/05/2020 4  % Final  . Eosinophils Absolute 11/05/2020 0.2  0.0 - 0.5 K/uL Final  . Basophils Relative 11/05/2020 1  % Final  . Basophils Absolute 11/05/2020 0.1  0.0 - 0.1 K/uL Final  . Immature Granulocytes 11/05/2020 0  % Final  . Abs Immature Granulocytes 11/05/2020 0.01  0.00 - 0.07 K/uL Final   Performed at [facility], [address] [place], [place] [address]    Assessment / Plan   #Erythrocytosis in 2019., JAK2 V617F mutation negative, with reflex to other mutations CALR, MPL, JAK 2 Ex 12-15 mutations negative. Likely secondary Patient is a non-smoker.    HCT is less than 50 I will hold off phlebotomy. If  erythrocytosis gets worse,Recommend patient to discuss with primary care provider for sleep apnea evaluation.   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 Avoid nephrotoxin.  #History of vitamin B12 deficiency Intrinsic factor and antiparietal cell antibodies were negative in 04/2018 2020-06-03, B12 1026  RTC 3 months H&H +/- phlebotomy  6 months for MD assessment, labs (CBC with diff, CMP, iron TIBC ferritin, urine protein electrophoresis), and +/- phlebotomy.    I discussed the assessment and treatment plan with the patient.  The patient was provided an opportunity to ask questions and all were answered.  The patient agreed with the plan and demonstrated an understanding of the instructions.  The patient was advised to call back if the symptoms worsen or if the condition fails to improve as anticipated.  Earlie Server, MD, PhD Hematology Oncology [place] at [facility] 11/07/2020

## 2020-11-07 ENCOUNTER — Encounter: Payer: Self-pay | Admitting: Urgent Care

## 2021-02-19 ENCOUNTER — Inpatient Hospital Stay: Payer: Medicare Other | Attending: Nurse Practitioner

## 2021-02-19 ENCOUNTER — Inpatient Hospital Stay: Payer: Medicare Other

## 2021-05-12 ENCOUNTER — Inpatient Hospital Stay: Payer: Medicare Other | Attending: Oncology

## 2021-05-12 ENCOUNTER — Other Ambulatory Visit: Payer: Self-pay

## 2021-05-12 ENCOUNTER — Other Ambulatory Visit: Payer: Medicare Other

## 2021-05-12 DIAGNOSIS — R803 Bence Jones proteinuria: Secondary | ICD-10-CM | POA: Diagnosis not present

## 2021-05-12 DIAGNOSIS — Z8546 Personal history of malignant neoplasm of prostate: Secondary | ICD-10-CM | POA: Insufficient documentation

## 2021-05-12 DIAGNOSIS — E538 Deficiency of other specified B group vitamins: Secondary | ICD-10-CM

## 2021-05-12 DIAGNOSIS — Z9079 Acquired absence of other genital organ(s): Secondary | ICD-10-CM | POA: Insufficient documentation

## 2021-05-12 DIAGNOSIS — Z85828 Personal history of other malignant neoplasm of skin: Secondary | ICD-10-CM | POA: Insufficient documentation

## 2021-05-12 DIAGNOSIS — D696 Thrombocytopenia, unspecified: Secondary | ICD-10-CM | POA: Insufficient documentation

## 2021-05-12 DIAGNOSIS — D751 Secondary polycythemia: Secondary | ICD-10-CM | POA: Insufficient documentation

## 2021-05-12 DIAGNOSIS — Z86010 Personal history of colonic polyps: Secondary | ICD-10-CM | POA: Insufficient documentation

## 2021-05-12 DIAGNOSIS — N1832 Chronic kidney disease, stage 3b: Secondary | ICD-10-CM | POA: Insufficient documentation

## 2021-05-12 LAB — CBC WITH DIFFERENTIAL/PLATELET
Abs Immature Granulocytes: 0.02 10*3/uL (ref 0.00–0.07)
Basophils Absolute: 0 10*3/uL (ref 0.0–0.1)
Basophils Relative: 1 %
Eosinophils Absolute: 0.2 10*3/uL (ref 0.0–0.5)
Eosinophils Relative: 3 %
HCT: 49.9 % (ref 39.0–52.0)
Hemoglobin: 16.8 g/dL (ref 13.0–17.0)
Immature Granulocytes: 0 %
Lymphocytes Relative: 15 %
Lymphs Abs: 0.8 10*3/uL (ref 0.7–4.0)
MCH: 31.5 pg (ref 26.0–34.0)
MCHC: 33.7 g/dL (ref 30.0–36.0)
MCV: 93.6 fL (ref 80.0–100.0)
Monocytes Absolute: 0.5 10*3/uL (ref 0.1–1.0)
Monocytes Relative: 10 %
Neutro Abs: 3.7 10*3/uL (ref 1.7–7.7)
Neutrophils Relative %: 71 %
Platelets: 184 10*3/uL (ref 150–400)
RBC: 5.33 MIL/uL (ref 4.22–5.81)
RDW: 12.3 % (ref 11.5–15.5)
WBC: 5.3 10*3/uL (ref 4.0–10.5)
nRBC: 0 % (ref 0.0–0.2)

## 2021-05-12 LAB — IRON AND TIBC
Iron: 144 ug/dL (ref 45–182)
Saturation Ratios: 38 % (ref 17.9–39.5)
TIBC: 381 ug/dL (ref 250–450)
UIBC: 237 ug/dL

## 2021-05-12 LAB — FERRITIN: Ferritin: 96 ng/mL (ref 24–336)

## 2021-05-13 ENCOUNTER — Inpatient Hospital Stay: Payer: Medicare Other

## 2021-05-13 ENCOUNTER — Other Ambulatory Visit: Payer: Medicare Other

## 2021-05-13 ENCOUNTER — Encounter: Payer: Self-pay | Admitting: Oncology

## 2021-05-13 ENCOUNTER — Ambulatory Visit: Payer: Medicare Other | Admitting: Oncology

## 2021-05-13 ENCOUNTER — Inpatient Hospital Stay: Payer: Medicare Other | Admitting: Oncology

## 2021-05-13 VITALS — BP 160/92 | HR 65 | Temp 97.9°F | Wt 178.0 lb

## 2021-05-13 DIAGNOSIS — D751 Secondary polycythemia: Secondary | ICD-10-CM

## 2021-05-13 DIAGNOSIS — R803 Bence Jones proteinuria: Secondary | ICD-10-CM | POA: Diagnosis not present

## 2021-05-13 DIAGNOSIS — N1832 Chronic kidney disease, stage 3b: Secondary | ICD-10-CM | POA: Diagnosis not present

## 2021-05-13 LAB — KAPPA/LAMBDA LIGHT CHAINS
Kappa free light chain: 27 mg/L — ABNORMAL HIGH (ref 3.3–19.4)
Kappa, lambda light chain ratio: 2.39 — ABNORMAL HIGH (ref 0.26–1.65)
Lambda free light chains: 11.3 mg/L (ref 5.7–26.3)

## 2021-05-13 NOTE — Progress Notes (Signed)
Hematology/Oncology Progress Note [phone] [phone]      Clinic Day:  05/13/2021  Referring physician: Dion Body, MD  Chief Complaint: James Watson is a 80 y.o. male presents for follow up for chronic thrombocytopenia, renal insufficiency, and erythrocytosis   INTERVAL HISTORY James Watson is a 80 y.o. male who has above history reviewed by me today presents for follow up visit for erythrocytosis, thrombocytopenia. Problems and complaints are listed below: Patient reports feeling well.  he is a retired Pharmacist, community. Patient previously followed up by Dr.Corcoran, patient switched care to me on 05/13/21 Extensive medical record review was performed by me  No new complaints  He has chronic kidney disease.  Myeloma panel on 04/06/2016 revealed no monoclonal protein and normal immunoglobulins. SPEP on 04/17/2018 revealed no monoclonal protein.   Light chain ratio 2.29. IFE urine on 04/19/2017 revealed 618 mg protein in 24 hours (30-150).   24 hour UPEP revealed no monoclonal protein.  There was 406 mg protein/24 hours. 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  He has history of erythrocytosis.  Hemoglobin was 16.6 on 05/03/2018.  He denies any cardiopulmonary disease, tobacco use, sleep apnea or testosterone use.  CXR on 05/08/2018 revealed no acute cardiopulmonary abnormalities.  04/17/2018 patient has normal erythropoietin level 12.8, normal carbo monoxide level, JAK2 V617F mutation negative, with reflex to other mutations CALR, MPL, JAK 2 Ex 12-15 mutations negative. He was advised by Dr. Mike Gip to have 300 cc phlebotomy every 3 months if hemoglobin is above 16.5.  INTERVAL HISTORY James Watson is a 80 y.o. 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  No new complaints. 24-hour urine testing was not submitted at the last  visit.    Review of Systems  Constitutional:  Negative for appetite change, chills, fatigue, fever and unexpected weight change.  HENT:   Negative for hearing loss and voice change.   Eyes:  Negative for eye problems and icterus.  Respiratory:  Negative for chest tightness, cough and shortness of breath.   Cardiovascular:  Negative for chest pain and leg swelling.  Gastrointestinal:  Negative for abdominal distention and abdominal pain.  Endocrine: Negative for hot flashes.  Genitourinary:  Negative for difficulty urinating, dysuria and frequency.   Musculoskeletal:  Negative for arthralgias.  Skin:  Negative for itching and rash.  Neurological:  Negative for light-headedness and numbness.  Hematological:  Negative for adenopathy. Does not bruise/bleed easily.  Psychiatric/Behavioral:  Negative for confusion.    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 Evorn Gong    Chronic renal insufficiency    Diabetes mellitus without complication ([place])    Hypercholesteremia    Hypertension    Osteoarthritis    Prostate cancer ([place])    Squamous cell carcinoma    TIA (transient ischemic attack)     Past Surgical History:  Procedure Laterality Date   CATARACT EXTRACTION W/PHACO Left 01/30/2019   Procedure: CATARACT EXTRACTION PHACO AND INTRAOCULAR LENS PLACEMENT ([place]) left diabetic  panoptix lens  00:59.4  15.3  9.09;  Surgeon: Leandrew Koyanagi, MD;  Location: [place];  Service: Ophthalmology;  Laterality: Left;  Diabetic - oral meds   CATARACT EXTRACTION W/PHACO Right 02/20/2019   Procedure: CATARACT EXTRACTION PHACO AND INTRAOCULAR LENS PLACEMENT ([facility]) RIGHT  DIABETIC PANOPTIX TORIC  0 ;54 11.0% 6.01;  Surgeon: Lockie Mola, MD;  [facility];  Service: Ophthalmology;  Laterality: Right;   COLONOSCOPY WITH PROPOFOL N/A 07/16/2015   Procedure:  COLONOSCOPY WITH PROPOFOL;  Surgeon: Scot Jun, MD;  Location: [facility] ENDOSCOPY;  Service: Endoscopy;  Laterality: N/A;   left shoulder arthroscopy     mtp repair     PROSTATECTOMY     tm repair      History reviewed. No pertinent family history.  Social History:  reports that he has never smoked. He has never used smokeless tobacco. He reports current alcohol use of about 2.0 standard drinks per week. No history on file for drug use. 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 He likes to golf.  He travels frequently. He lives in [place].  He lives in a retirement community.  The patient is alone today.  Allergies:  Allergies  Allergen Reactions   Enalapril Maleate Other (See Comments)    Difficulty breathing   Hydrocodone Other (See Comments)   Hydrocodone-Acetaminophen Nausea Only    Hallucinations    Current Medications: Current Outpatient Medications  Medication Sig Dispense Refill   acetaminophen (TYLENOL) 650 MG CR tablet Take 1,300 mg by mouth daily as needed for pain.     amLODipine (NORVASC) 10 MG tablet Take 10 mg by mouth daily.      aspirin EC 81 MG tablet Take 81 mg by mouth daily.      atorvastatin (LIPITOR) 80 MG tablet TAKE 1/2 TABLET BY MOUTH EVERY NIGHT  1   Blood Glucose Monitoring Suppl (ONE TOUCH ULTRA 2) W/DEVICE KIT USE AS DIRECTED. DX: E11.22, N18.2  0   Cholecalciferol 25 MCG (1000 UT) tablet Take 2,000 Units by mouth daily.      Coenzyme Q10 (CO Q-10) 100 MG CAPS Take 1 capsule by mouth daily.      glimepiride (AMARYL) 1 MG tablet TAKE 1 TABLET (1 MG TOTAL) BY MOUTH DAILY WITH BREAKFAST.  3   hydrochlorothiazide (HYDRODIURIL) 12.5 MG tablet Take 12.5 mg by mouth daily.     hydroxychloroquine (PLAQUENIL) 200 MG tablet Take 100 mg by mouth daily.      losartan (COZAAR) 50 MG tablet Take 1 tablet by mouth 2 (two) times daily.     ONE TOUCH ULTRA TEST test strip USE AS DIRECTED. CHECK CBG'S FASTING ONCE DAILY. DX: E11.22, N18.2  11    ONETOUCH DELICA LANCETS FINE MISC Use 1 Units as directed. Check CBG's fasting once daily. Dx: E11.22, N18.2     Turmeric 400 MG CAPS Take 1 capsule by mouth 2 (two) times a day.      vitamin B-12 (CYANOCOBALAMIN) 1000 MCG tablet Take 1,000 mcg by mouth daily.     zolpidem (AMBIEN) 5 MG tablet Take one tab nightly prn for insomnia while traveling through different time zones (Patient not taking: Reported on 11/05/2020)     No current facility-administered medications for this visit.     Performance status (ECOG): 1  Vitals Blood pressure (!) 160/92, pulse 65, temperature 97.9 F (36.6 C), temperature source Tympanic, weight 178 lb (80.7 kg).   Physical Exam Vitals and nursing note reviewed.  Constitutional:      General: He is not in acute distress.    Appearance: He is well-developed. He is not diaphoretic.  HENT:     Head: Normocephalic and atraumatic.     Mouth/Throat:     Pharynx: No oropharyngeal exudate.  Eyes:     General: No scleral icterus.    Pupils: Pupils are equal, round, and reactive to light.  Neck:     Vascular: No JVD.  Cardiovascular:     Rate and Rhythm: Normal rate and regular rhythm.     Heart sounds: Normal heart sounds. No murmur heard. Pulmonary:     Effort: Pulmonary effort is normal. No respiratory distress.     Breath sounds: Normal breath sounds. No wheezing or rales.  Abdominal:     General: Bowel sounds are normal. There is no distension.     Palpations: Abdomen is soft.     Tenderness: There is no abdominal tenderness.  Musculoskeletal:        General: Normal range of motion.     Cervical back: Normal range of motion and neck supple.  Lymphadenopathy:     Head:     Right side of head: No preauricular, posterior auricular or occipital adenopathy.     Left side of head: No preauricular, posterior auricular or occipital adenopathy.     Cervical: No cervical adenopathy.     Upper Body:     Right upper body: No supraclavicular adenopathy.      Left upper body: No supraclavicular adenopathy.     Lower Body: No right inguinal adenopathy. No left inguinal adenopathy.  Skin:    General: Skin is warm and dry.     Coloration: Skin is not pale.     Findings: No erythema or rash.  Neurological:     Mental Status: He is alert and oriented to person, place, and time.  Psychiatric:        Mood and Affect: Mood normal.        Behavior: Behavior normal.    Appointment on 05/12/2021  Component Date Value Ref Range Status   Iron 05/12/2021 144  45 - 182 ug/dL Final   TIBC 05/12/2021 381  250 - 450 ug/dL Final   Saturation Ratios 05/12/2021 38  17.9 - 39.5 % Final   UIBC 05/12/2021 237  ug/dL Final   Performed at [facility], [address] [place], [place] [address]   Ferritin 05/12/2021 96  24 - 336 ng/mL Final   Performed at [facility], [place], [place] [address]   WBC 05/12/2021 5.3  4.0 - 10.5 K/uL Final   RBC 05/12/2021 5.33  4.22 - 5.81 MIL/uL Final   Hemoglobin 05/12/2021 16.8  13.0 - 17.0 g/dL Final   HCT 05/12/2021 49.9  39.0 - 52.0 % Final   MCV 05/12/2021 93.6  80.0 - 100.0 fL Final   MCH 05/12/2021 31.5  26.0 - 34.0 pg Final   MCHC 05/12/2021 33.7  30.0 - 36.0 g/dL Final   RDW 05/12/2021 12.3  11.5 - 15.5 % Final   Platelets 05/12/2021 184  150 - 400 K/uL Final   nRBC 05/12/2021 0.0  0.0 - 0.2 % Final   Neutrophils Relative % 05/12/2021 71  % Final   Neutro Abs 05/12/2021 3.7  1.7 - 7.7 K/uL Final   Lymphocytes Relative 05/12/2021 15  % Final   Lymphs Abs 05/12/2021 0.8  0.7 - 4.0 K/uL Final   Monocytes Relative 05/12/2021 10  % Final   Monocytes Absolute 05/12/2021 0.5  0.1 - 1.0 K/uL Final   Eosinophils Relative 05/12/2021 3  % Final   Eosinophils Absolute 05/12/2021 0.2  0.0 - 0.5 K/uL Final   Basophils Relative 05/12/2021 1  % Final   Basophils  Absolute 05/12/2021 0.0  0.0 - 0.1 K/uL Final   Immature Granulocytes 05/12/2021 0  % Final   Abs Immature Granulocytes 05/12/2021  0.02  0.00 - 0.07 K/uL Final   Performed at [facility], [address] [place], [place] [address]    Assessment / Plan  1. Bence Jones protein   2. Erythrocytosis   3. Stage 3b chronic kidney disease ([place])     #Bence-Jones protein in the urine. Multiple myeloma panel and light chain ratio are pending. Obtain 24-hour UPEP/immunofixation.  #Erythrocytosis in 2019., JAK2 V617F mutation negative, with reflex to other mutations CALR, MPL, JAK 2 Ex 12-15 mutations negative. Labs reviewed and discussed with patient.  Hemoglobin hematocrit are within normal limit.  No need for phlebotomy    #Chronic kidney disease, avoid nephrotoxins.  Stable kidney function.  RTC follow-up in 6 months.  I discussed the assessment and treatment plan with the patient.  The patient was provided an opportunity to ask questions and all were answered.  The patient agreed with the plan and demonstrated an understanding of the instructions.  The patient was advised to call back if the symptoms worsen or if the condition fails to improve as anticipated.  Earlie Server, MD, PhD  05/13/2021

## 2021-05-14 LAB — PROTEIN ELECTROPHORESIS, SERUM
A/G Ratio: 1.6 (ref 0.7–1.7)
Albumin ELP: 3.9 g/dL (ref 2.9–4.4)
Alpha-1-Globulin: 0.1 g/dL (ref 0.0–0.4)
Alpha-2-Globulin: 0.7 g/dL (ref 0.4–1.0)
Beta Globulin: 1 g/dL (ref 0.7–1.3)
Gamma Globulin: 0.5 g/dL (ref 0.4–1.8)
Globulin, Total: 2.4 g/dL (ref 2.2–3.9)
Total Protein ELP: 6.3 g/dL (ref 6.0–8.5)

## 2021-05-18 DIAGNOSIS — D751 Secondary polycythemia: Secondary | ICD-10-CM | POA: Diagnosis not present

## 2021-05-19 ENCOUNTER — Other Ambulatory Visit: Payer: Self-pay

## 2021-05-19 DIAGNOSIS — D696 Thrombocytopenia, unspecified: Secondary | ICD-10-CM

## 2021-05-19 DIAGNOSIS — N1832 Chronic kidney disease, stage 3b: Secondary | ICD-10-CM

## 2021-05-19 DIAGNOSIS — E538 Deficiency of other specified B group vitamins: Secondary | ICD-10-CM

## 2021-05-19 DIAGNOSIS — D751 Secondary polycythemia: Secondary | ICD-10-CM

## 2021-05-21 LAB — IFE+PROTEIN ELECTRO, 24-HR UR
% BETA, Urine: 0 %
ALPHA 1 URINE: 0 %
Albumin, U: 100 %
Alpha 2, Urine: 0 %
GAMMA GLOBULIN URINE: 0 %
Total Protein, Urine-Ur/day: 606 mg/24 hr — ABNORMAL HIGH (ref 30–150)
Total Protein, Urine: 20.2 mg/dL
Total Volume: 3000

## 2021-11-11 ENCOUNTER — Inpatient Hospital Stay: Payer: Medicare Other

## 2021-11-18 ENCOUNTER — Ambulatory Visit: Payer: Medicare Other | Admitting: Oncology

## 2021-11-29 ENCOUNTER — Inpatient Hospital Stay: Payer: Medicare Other | Attending: Oncology

## 2021-11-29 DIAGNOSIS — D751 Secondary polycythemia: Secondary | ICD-10-CM | POA: Insufficient documentation

## 2021-11-29 DIAGNOSIS — Z85828 Personal history of other malignant neoplasm of skin: Secondary | ICD-10-CM | POA: Diagnosis not present

## 2021-11-29 DIAGNOSIS — Z9079 Acquired absence of other genital organ(s): Secondary | ICD-10-CM | POA: Insufficient documentation

## 2021-11-29 DIAGNOSIS — D696 Thrombocytopenia, unspecified: Secondary | ICD-10-CM | POA: Diagnosis not present

## 2021-11-29 DIAGNOSIS — N1832 Chronic kidney disease, stage 3b: Secondary | ICD-10-CM | POA: Insufficient documentation

## 2021-11-29 DIAGNOSIS — Z8546 Personal history of malignant neoplasm of prostate: Secondary | ICD-10-CM | POA: Insufficient documentation

## 2021-11-29 DIAGNOSIS — R803 Bence Jones proteinuria: Secondary | ICD-10-CM | POA: Diagnosis not present

## 2021-11-29 DIAGNOSIS — Z86010 Personal history of colonic polyps: Secondary | ICD-10-CM | POA: Insufficient documentation

## 2021-11-29 LAB — COMPREHENSIVE METABOLIC PANEL
ALT: 22 U/L (ref 0–44)
AST: 26 U/L (ref 15–41)
Albumin: 4.1 g/dL (ref 3.5–5.0)
Alkaline Phosphatase: 57 U/L (ref 38–126)
Anion gap: 8 (ref 5–15)
BUN: 33 mg/dL — ABNORMAL HIGH (ref 8–23)
CO2: 27 mmol/L (ref 22–32)
Calcium: 8.8 mg/dL — ABNORMAL LOW (ref 8.9–10.3)
Chloride: 103 mmol/L (ref 98–111)
Creatinine, Ser: 2.29 mg/dL — ABNORMAL HIGH (ref 0.61–1.24)
GFR, Estimated: 28 mL/min — ABNORMAL LOW (ref >60)
Glucose, Bld: 166 mg/dL — ABNORMAL HIGH (ref 70–99)
Potassium: 3.7 mmol/L (ref 3.5–5.1)
Sodium: 138 mmol/L (ref 135–145)
Total Bilirubin: 0.9 mg/dL (ref 0.3–1.2)
Total Protein: 6.7 g/dL (ref 6.5–8.1)

## 2021-11-29 LAB — CBC WITH DIFFERENTIAL/PLATELET
Abs Immature Granulocytes: 0.01 10*3/uL (ref 0.00–0.07)
Basophils Absolute: 0.1 10*3/uL (ref 0.0–0.1)
Basophils Relative: 1 %
Eosinophils Absolute: 0.3 10*3/uL (ref 0.0–0.5)
Eosinophils Relative: 6 %
HCT: 54.8 % — ABNORMAL HIGH (ref 39.0–52.0)
Hemoglobin: 18.4 g/dL — ABNORMAL HIGH (ref 13.0–17.0)
Immature Granulocytes: 0 %
Lymphocytes Relative: 16 %
Lymphs Abs: 0.7 10*3/uL (ref 0.7–4.0)
MCH: 31.8 pg (ref 26.0–34.0)
MCHC: 33.6 g/dL (ref 30.0–36.0)
MCV: 94.8 fL (ref 80.0–100.0)
Monocytes Absolute: 0.5 10*3/uL (ref 0.1–1.0)
Monocytes Relative: 10 %
Neutro Abs: 3 10*3/uL (ref 1.7–7.7)
Neutrophils Relative %: 67 %
Platelets: 158 10*3/uL (ref 150–400)
RBC: 5.78 MIL/uL (ref 4.22–5.81)
RDW: 13.6 % (ref 11.5–15.5)
WBC: 4.6 10*3/uL (ref 4.0–10.5)
nRBC: 0 % (ref 0.0–0.2)

## 2021-11-30 LAB — KAPPA/LAMBDA LIGHT CHAINS
Kappa free light chain: 40.4 mg/L — ABNORMAL HIGH (ref 3.3–19.4)
Kappa, lambda light chain ratio: 2.99 — ABNORMAL HIGH (ref 0.26–1.65)
Lambda free light chains: 13.5 mg/L (ref 5.7–26.3)

## 2021-12-02 LAB — MULTIPLE MYELOMA PANEL, SERUM
Albumin SerPl Elph-Mcnc: 3.9 g/dL (ref 2.9–4.4)
Albumin/Glob SerPl: 1.7 (ref 0.7–1.7)
Alpha 1: 0.2 g/dL (ref 0.0–0.4)
Alpha2 Glob SerPl Elph-Mcnc: 0.6 g/dL (ref 0.4–1.0)
B-Globulin SerPl Elph-Mcnc: 1 g/dL (ref 0.7–1.3)
Gamma Glob SerPl Elph-Mcnc: 0.6 g/dL (ref 0.4–1.8)
Globulin, Total: 2.4 g/dL (ref 2.2–3.9)
IgA: 171 mg/dL (ref 61–437)
IgG (Immunoglobin G), Serum: 617 mg/dL (ref 603–1613)
IgM (Immunoglobulin M), Srm: 50 mg/dL (ref 15–143)
Total Protein ELP: 6.3 g/dL (ref 6.0–8.5)

## 2021-12-15 ENCOUNTER — Inpatient Hospital Stay: Payer: Medicare Other | Attending: Oncology | Admitting: Oncology

## 2021-12-15 ENCOUNTER — Encounter: Payer: Self-pay | Admitting: Oncology

## 2021-12-15 VITALS — BP 121/73 | HR 70 | Temp 97.0°F | Resp 16 | Ht 65.0 in | Wt 174.0 lb

## 2021-12-15 DIAGNOSIS — D696 Thrombocytopenia, unspecified: Secondary | ICD-10-CM | POA: Insufficient documentation

## 2021-12-15 DIAGNOSIS — Z8546 Personal history of malignant neoplasm of prostate: Secondary | ICD-10-CM | POA: Diagnosis not present

## 2021-12-15 DIAGNOSIS — N1832 Chronic kidney disease, stage 3b: Secondary | ICD-10-CM | POA: Diagnosis not present

## 2021-12-15 DIAGNOSIS — R803 Bence Jones proteinuria: Secondary | ICD-10-CM | POA: Diagnosis not present

## 2021-12-15 DIAGNOSIS — Z85828 Personal history of other malignant neoplasm of skin: Secondary | ICD-10-CM | POA: Insufficient documentation

## 2021-12-15 DIAGNOSIS — Z86010 Personal history of colonic polyps: Secondary | ICD-10-CM | POA: Insufficient documentation

## 2021-12-15 DIAGNOSIS — D751 Secondary polycythemia: Secondary | ICD-10-CM | POA: Insufficient documentation

## 2021-12-15 DIAGNOSIS — Z9079 Acquired absence of other genital organ(s): Secondary | ICD-10-CM | POA: Insufficient documentation

## 2021-12-15 NOTE — Progress Notes (Signed)
Hematology/Oncology Progress Note [phone] [phone]      Clinic Day:  12/15/2021  Referring physician: Dion Body, MD  Chief Complaint: James Watson is a 81 y.o. male presents for follow up for chronic thrombocytopenia, renal insufficiency, and erythrocytosis   INTERVAL HISTORY James Watson is a 81 y.o. male who has above history reviewed by me today presents for follow up visit for erythrocytosis, thrombocytopenia. Problems and complaints are listed below: Patient reports feeling well.  he is a retired Pharmacist, community. Patient previously followed up by Dr.Corcoran, patient switched care to me on 12/15/21 Extensive medical record review was performed by me  No new complaints  He has chronic kidney disease.  Myeloma panel on 04/06/2016 revealed no monoclonal protein and normal immunoglobulins. SPEP on 04/17/2018 revealed no monoclonal protein.   Light chain ratio 2.29. IFE urine on 04/19/2017 revealed 618 mg protein in 24 hours (30-150).   24 hour UPEP revealed no monoclonal protein.  There was 406 mg protein/24 hours. 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  He has history of erythrocytosis.  Hemoglobin was 16.6 on 05/03/2018.  He denies any cardiopulmonary disease, tobacco use, sleep apnea or testosterone use.  CXR on 05/08/2018 revealed no acute cardiopulmonary abnormalities.  04/17/2018 patient has normal erythropoietin level 12.8, normal carbo monoxide level, JAK2 V617F mutation negative, with reflex to other mutations CALR, MPL, JAK 2 Ex 12-15 mutations negative. He was advised by Dr. Mike Gip to have 300 cc phlebotomy every 3 months if hemoglobin is above 16.5.  INTERVAL HISTORY [name] Watson is a 81 y.o. 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  No new complaints. He recently had blood work done at nephrologist office which showed a  hemoglobin of 18. 11/29/2021, hemoglobin 18.4, hematocrit 54.8.  Patient denies smoking or history of sleep study..  He has a history of erythrocytosis which resolved recently.  He had a negative peripheral blood work-up for primary myeloproliferative disease.  He previously tolerated phlebotomy.   Review of Systems  Constitutional:  Negative for appetite change, chills, fatigue, fever and unexpected weight change.  HENT:   Negative for hearing loss and voice change.   Eyes:  Negative for eye problems and icterus.  Respiratory:  Negative for chest tightness, cough and shortness of breath.   Cardiovascular:  Negative for chest pain and leg swelling.  Gastrointestinal:  Negative for abdominal distention and abdominal pain.  Endocrine: Negative for hot flashes.  Genitourinary:  Negative for difficulty urinating, dysuria and frequency.   Musculoskeletal:  Negative for arthralgias.  Skin:  Negative for itching and rash.  Neurological:  Negative for light-headedness and numbness.  Hematological:  Negative for adenopathy. Does not bruise/bleed easily.  Psychiatric/Behavioral:  Negative for confusion.     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 Evorn Gong    Chronic renal insufficiency    Diabetes mellitus without complication ([place])    Hypercholesteremia    Hypertension    Osteoarthritis    Prostate cancer ([place])    Squamous cell carcinoma    TIA (transient ischemic attack)     Past Surgical History:  Procedure Laterality Date   CATARACT EXTRACTION W/PHACO Left 01/30/2019   Procedure: CATARACT EXTRACTION PHACO AND INTRAOCULAR LENS PLACEMENT (IOC) left diabetic  panoptix lens  00:59.4  15.3  9.09;  Surgeon: Leandrew Koyanagi, MD;  Location: [place];  Service: Ophthalmology;  Laterality: Left;  Diabetic - oral meds   CATARACT EXTRACTION W/PHACO Right 02/20/2019   Procedure:  CATARACT EXTRACTION PHACO AND INTRAOCULAR LENS PLACEMENT (IOC) RIGHT DIABETIC PANOPTIX TORIC  0 ;54 11.0% 6.01;  Surgeon: Leandrew Koyanagi, MD;  Location: [place];  Service: Ophthalmology;  Laterality: Right;   COLONOSCOPY WITH PROPOFOL N/A 07/16/2015   Procedure: COLONOSCOPY WITH PROPOFOL;  Surgeon: Manya Silvas, MD;  Location: [facility] ENDOSCOPY;  Service: Endoscopy;  Laterality: N/A;   left shoulder arthroscopy     mtp repair     PROSTATECTOMY     tm repair      History reviewed. No pertinent family history.  Social History:  reports that he has never smoked. He has never used smokeless tobacco. He reports current alcohol use of about 2.0 standard drinks of alcohol per week. No history on file for drug use.  Allergies:  Allergies  Allergen Reactions   Enalapril Maleate Other (See Comments)    Difficulty breathing   Hydrocodone Other (See Comments)   Hydrocodone-Acetaminophen Nausea Only    Hallucinations    Current Medications: Current Outpatient Medications  Medication Sig Dispense Refill   acetaminophen (TYLENOL) 650 MG CR tablet Take 1,300 mg by mouth daily as needed for pain.     amLODipine (NORVASC) 10 MG tablet Take 10 mg by mouth daily.      aspirin EC 81 MG tablet Take 81 mg by mouth daily.      atorvastatin (LIPITOR) 80 MG tablet TAKE 1/2 TABLET BY MOUTH EVERY NIGHT  1   Blood Glucose Monitoring Suppl (ONE TOUCH ULTRA 2) W/DEVICE KIT USE AS DIRECTED. DX: E11.22, N18.2  0   Cholecalciferol 25 MCG (1000 UT) tablet Take 2,000 Units by mouth daily.      Coenzyme Q10 (CO Q-10) 100 MG CAPS Take 1 capsule by mouth daily.      dapagliflozin propanediol (FARXIGA) 10 MG TABS tablet Take by mouth.     glimepiride (AMARYL) 1 MG tablet TAKE 1 TABLET (1 MG TOTAL) BY MOUTH DAILY WITH BREAKFAST.  3   hydrochlorothiazide (HYDRODIURIL) 12.5 MG tablet Take 12.5 mg by mouth daily.     hydroxychloroquine (PLAQUENIL) 200 MG tablet Take 100 mg by mouth daily.      losartan  (COZAAR) 50 MG tablet Take 1 tablet by mouth 2 (two) times daily.     ONE TOUCH ULTRA TEST test strip USE AS DIRECTED. CHECK CBG'S FASTING ONCE DAILY. DX: E11.22, N18.2  11   ONETOUCH DELICA LANCETS FINE MISC Use 1 Units as directed. Check CBG's fasting once daily. Dx: E11.22, N18.2     Turmeric 400 MG CAPS Take 1 capsule by mouth 2 (two) times a day.      vitamin B-12 (CYANOCOBALAMIN) 1000 MCG tablet Take 1,000 mcg by mouth daily.     No current facility-administered medications for this visit.     Performance status (ECOG): 1  Vitals Blood pressure 121/73, pulse 70, temperature (!) 97 F (36.1 C), temperature source Tympanic, resp. rate 16, height $RemoveBe'5\' 5"'jtQWOvzAd$  (1.651 m), weight 174 lb (78.9 kg), SpO2 94 %.   Physical Exam Vitals and nursing note reviewed.  Constitutional:      General: He is not in acute distress.    Appearance: He is well-developed. He is not diaphoretic.  HENT:     Head: Normocephalic and atraumatic.     Mouth/Throat:  Pharynx: No oropharyngeal exudate.  Eyes:     General: No scleral icterus.    Pupils: Pupils are equal, round, and reactive to light.  Neck:     Vascular: No JVD.  Cardiovascular:     Rate and Rhythm: Normal rate and regular rhythm.     Heart sounds: Normal heart sounds. No murmur heard. Pulmonary:     Effort: Pulmonary effort is normal. No respiratory distress.     Breath sounds: Normal breath sounds. No wheezing or rales.  Abdominal:     General: Bowel sounds are normal. There is no distension.     Palpations: Abdomen is soft.     Tenderness: There is no abdominal tenderness.  Musculoskeletal:        General: Normal range of motion.     Cervical back: Normal range of motion and neck supple.  Lymphadenopathy:     Head:     Right side of head: No preauricular, posterior auricular or occipital adenopathy.     Left side of head: No preauricular, posterior auricular or occipital adenopathy.     Cervical: No cervical adenopathy.     Upper  Body:     Right upper body: No supraclavicular adenopathy.     Left upper body: No supraclavicular adenopathy.     Lower Body: No right inguinal adenopathy. No left inguinal adenopathy.  Skin:    General: Skin is warm and dry.     Coloration: Skin is not pale.     Findings: No erythema or rash.  Neurological:     Mental Status: He is alert and oriented to person, place, and time.  Psychiatric:        Mood and Affect: Mood normal.        Behavior: Behavior normal.     Labs are reviewed by me    Latest Ref Rng & Units 11/29/2021   10:05 AM 05/12/2021    9:21 AM 11/05/2020    1:04 PM  CBC  WBC 4.0 - 10.5 K/uL 4.6  5.3  4.9   Hemoglobin 13.0 - 17.0 g/dL 29.7  52.2  14.1   Hematocrit 39.0 - 52.0 % 54.8  49.9  48.6   Platelets 150 - 400 K/uL 158  184  173       Latest Ref Rng & Units 11/29/2021   10:05 AM 04/17/2018   11:09 AM 04/18/2017   10:08 AM  CMP  Glucose 70 - 99 mg/dL 928  680  626   BUN 8 - 23 mg/dL 33  26  29   Creatinine 0.61 - 1.24 mg/dL 0.07  6.73  5.89   Sodium 135 - 145 mmol/L 138  135  137   Potassium 3.5 - 5.1 mmol/L 3.7  3.8  4.0   Chloride 98 - 111 mmol/L 103  101  101   CO2 22 - 32 mmol/L 27  27  28    Calcium 8.9 - 10.3 mg/dL 8.8  9.3  9.0   Total Protein 6.5 - 8.1 g/dL 6.7  7.0  6.7   Total Bilirubin 0.3 - 1.2 mg/dL 0.9  0.7  0.8   Alkaline Phos 38 - 126 U/L 57  65  67   AST 15 - 41 U/L 26  36  37   ALT 0 - 44 U/L 22  36  29      Assessment / Plan  1. Erythrocytosis   2. Bence Jones protein   3. 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 Multiple myeloma panel is negative for M protein.  light chain ratio 2.99, slightly increased. Continue observation.  Repeat labs in 6 months.  We will check 24-hour urine protein electrophoresis at that time.   #Erythrocytosis in 2019., JAK2 V617F mutation negative, with reflex to other mutations CALR, MPL, JAK 2 Ex 12-15 mutations negative. Labs reviewed and discussed with  patient.  Recommend patient to have sleep study done We will schedule patient to get phlebotomy 300 cc if hematocrit is above 52.    #Chronic kidney disease, avoid nephrotoxins.  Kidney function slightly worse.  RTC follow-up  Lab next week H& +/- phleb Lab in 5 weeks H&H +/- Phleb Lab MD [same day] in 6 months +/- phleb, myeloma lab set + 24 hour UPEP   I discussed the assessment and treatment plan with the patient.  The patient was provided an opportunity to ask questions and all were answered.  The patient agreed with the plan and demonstrated an understanding of the instructions.  The patient was advised to call back if the symptoms worsen or if the condition fails to improve as anticipated.  Earlie Server, MD, PhD  12/15/2021

## 2021-12-17 ENCOUNTER — Encounter: Payer: Self-pay | Admitting: *Deleted

## 2021-12-20 ENCOUNTER — Encounter: Payer: Self-pay | Admitting: *Deleted

## 2021-12-20 ENCOUNTER — Ambulatory Visit: Payer: Medicare Other | Admitting: General Practice

## 2021-12-20 ENCOUNTER — Ambulatory Visit
Admission: RE | Admit: 2021-12-20 | Discharge: 2021-12-20 | Disposition: A | Discharge status: Home / Self Care | Payer: Medicare Other | Attending: Gastroenterology | Admitting: Gastroenterology

## 2021-12-20 ENCOUNTER — Encounter
Admission: RE | Disposition: A | Discharge status: Home / Self Care | Payer: Self-pay | Source: Home / Self Care | Attending: Gastroenterology

## 2021-12-20 DIAGNOSIS — D123 Benign neoplasm of transverse colon: Secondary | ICD-10-CM | POA: Insufficient documentation

## 2021-12-20 DIAGNOSIS — D125 Benign neoplasm of sigmoid colon: Secondary | ICD-10-CM | POA: Diagnosis not present

## 2021-12-20 DIAGNOSIS — M1990 Unspecified osteoarthritis, unspecified site: Secondary | ICD-10-CM | POA: Diagnosis not present

## 2021-12-20 DIAGNOSIS — Z8546 Personal history of malignant neoplasm of prostate: Secondary | ICD-10-CM | POA: Insufficient documentation

## 2021-12-20 DIAGNOSIS — Z86010 Personal history of colonic polyps: Secondary | ICD-10-CM | POA: Diagnosis present

## 2021-12-20 DIAGNOSIS — Z85828 Personal history of other malignant neoplasm of skin: Secondary | ICD-10-CM | POA: Diagnosis not present

## 2021-12-20 DIAGNOSIS — E7800 Pure hypercholesterolemia, unspecified: Secondary | ICD-10-CM | POA: Insufficient documentation

## 2021-12-20 DIAGNOSIS — Z1211 Encounter for screening for malignant neoplasm of colon: Secondary | ICD-10-CM | POA: Diagnosis not present

## 2021-12-20 DIAGNOSIS — E1122 Type 2 diabetes mellitus with diabetic chronic kidney disease: Secondary | ICD-10-CM | POA: Insufficient documentation

## 2021-12-20 DIAGNOSIS — E785 Hyperlipidemia, unspecified: Secondary | ICD-10-CM | POA: Diagnosis not present

## 2021-12-20 DIAGNOSIS — D120 Benign neoplasm of cecum: Secondary | ICD-10-CM | POA: Diagnosis not present

## 2021-12-20 DIAGNOSIS — K5730 Diverticulosis of large intestine without perforation or abscess without bleeding: Secondary | ICD-10-CM | POA: Diagnosis not present

## 2021-12-20 DIAGNOSIS — Z7984 Long term (current) use of oral hypoglycemic drugs: Secondary | ICD-10-CM | POA: Diagnosis not present

## 2021-12-20 DIAGNOSIS — I129 Hypertensive chronic kidney disease with stage 1 through stage 4 chronic kidney disease, or unspecified chronic kidney disease: Secondary | ICD-10-CM | POA: Insufficient documentation

## 2021-12-20 DIAGNOSIS — K640 First degree hemorrhoids: Secondary | ICD-10-CM | POA: Diagnosis not present

## 2021-12-20 DIAGNOSIS — N189 Chronic kidney disease, unspecified: Secondary | ICD-10-CM | POA: Diagnosis not present

## 2021-12-20 DIAGNOSIS — Z8673 Personal history of transient ischemic attack (TIA), and cerebral infarction without residual deficits: Secondary | ICD-10-CM | POA: Insufficient documentation

## 2021-12-20 HISTORY — PX: COLONOSCOPY WITH PROPOFOL: SHX5780

## 2021-12-20 LAB — GLUCOSE, CAPILLARY: Glucose-Capillary: 130 mg/dL — ABNORMAL HIGH (ref 70–99)

## 2021-12-20 SURGERY — COLONOSCOPY WITH PROPOFOL
Anesthesia: General

## 2021-12-20 MED ORDER — LIDOCAINE HCL (CARDIAC) PF 100 MG/5ML IV SOSY
PREFILLED_SYRINGE | INTRAVENOUS | Status: DC | PRN
  Administered 2021-12-20: 40 mg via INTRAVENOUS

## 2021-12-20 MED ORDER — PROPOFOL 1000 MG/100ML IV EMUL
INTRAVENOUS | Status: AC
  Filled 2021-12-20: qty 100

## 2021-12-20 MED ORDER — SODIUM CHLORIDE 0.9 % IV SOLN
INTRAVENOUS | Status: DC
Start: 2021-12-20 — End: 2021-12-20
  Administered 2021-12-20: 1000 mL via INTRAVENOUS

## 2021-12-20 MED ORDER — PROPOFOL 500 MG/50ML IV EMUL
INTRAVENOUS | Status: DC | PRN
  Administered 2021-12-20: 120 ug/kg/min via INTRAVENOUS

## 2021-12-20 MED ORDER — LIDOCAINE HCL (PF) 2 % IJ SOLN
INTRAMUSCULAR | Status: AC
  Filled 2021-12-20: qty 5

## 2021-12-20 NOTE — Transfer of Care (Signed)
Immediate Anesthesia Transfer of Care Note  Patient: James Watson  Procedure(s) Performed: COLONOSCOPY WITH PROPOFOL  Patient Location: PACU  Anesthesia Type:General  Level of Consciousness: awake and sedated  Airway & Oxygen Therapy: Patient Spontanous Breathing and Patient connected to nasal cannula oxygen  Post-op Assessment: Report given to RN and Post -op Vital signs reviewed and stable  Post vital signs: Reviewed and stable  Last Vitals:  Vitals Value Taken Time  BP    Temp    Pulse    Resp    SpO2      Last Pain:  Vitals:   12/20/21 0953  TempSrc: Temporal         Complications: No notable events documented.

## 2021-12-20 NOTE — Anesthesia Preprocedure Evaluation (Signed)
Anesthesia Evaluation  Patient identified by MRN, date of birth, ID band Patient awake    Reviewed: Allergy & Precautions, NPO status , Patient's Chart, lab work & pertinent test results  Airway Mallampati: III  TM Distance: >3 FB Neck ROM: full    Dental no notable dental hx.    Pulmonary neg pulmonary ROS,    Pulmonary exam normal        Cardiovascular hypertension, negative cardio ROS Normal cardiovascular exam     Neuro/Psych TIAnegative psych ROS   GI/Hepatic negative GI ROS, Neg liver ROS,   Endo/Other  negative endocrine ROSdiabetes  Renal/GU negative Renal ROS  negative genitourinary   Musculoskeletal   Abdominal   Peds  Hematology negative hematology ROS (+)   Anesthesia Other Findings Past Medical History: 06/18/2015: Adenomatous colon polyp No date: Basal cell carcinoma No date: BCC (basal cell carcinoma)     Comment:  followed by Dr. Evorn Gong  No date: Chronic renal insufficiency No date: Diabetes mellitus without complication ([place]) No date: Hypercholesteremia No date: Hypertension No date: Osteoarthritis No date: Prostate cancer ([place]) No date: Squamous cell carcinoma No date: TIA (transient ischemic attack)  Past Surgical History: 01/30/2019: CATARACT EXTRACTION W/PHACO; Left     Comment:  Procedure: CATARACT EXTRACTION PHACO AND INTRAOCULAR               LENS PLACEMENT ([place]) left diabetic  panoptix lens                00:59.4  15.3  9.09;  Surgeon: Leandrew Koyanagi, MD;               Location: [place];  Service: Ophthalmology;                Laterality: Left;  Diabetic - oral meds 02/20/2019: CATARACT EXTRACTION W/PHACO; Right     Comment:  Procedure: CATARACT EXTRACTION PHACO AND INTRAOCULAR               LENS PLACEMENT (IOC) RIGHT DIABETIC PANOPTIX TORIC  0 ;54              11.0% 6.01;  Surgeon: Leandrew Koyanagi, MD;                Location: [place];   Service: Ophthalmology;                Laterality: Right; 07/16/2015: COLONOSCOPY WITH PROPOFOL; N/A     Comment:  Procedure: COLONOSCOPY WITH PROPOFOL;  Surgeon: Manya Silvas, MD;  Location: [facility] ENDOSCOPY;  Service:               Endoscopy;  Laterality: N/A; No date: EYE SURGERY No date: left shoulder arthroscopy No date: mtp repair No date: PROSTATECTOMY No date: tm repair  BMI    Body Mass Index: 26.68 kg/m      Reproductive/Obstetrics negative OB ROS                            Anesthesia Physical Anesthesia Plan  ASA: 2  Anesthesia Plan: General   Post-op Pain Management: Minimal or no pain anticipated   Induction: Intravenous  PONV Risk Score and Plan: 3 and Propofol infusion, TIVA and Ondansetron  Airway Management Planned: Nasal Cannula  Additional Equipment: None  Intra-op Plan:   Post-operative Plan:   Informed Consent: I have reviewed the patients History and  Physical, chart, labs and discussed the procedure including the risks, benefits and alternatives for the proposed anesthesia with the patient or authorized representative who has indicated his/her understanding and acceptance.     Dental advisory given  Plan Discussed with: CRNA and Surgeon  Anesthesia Plan Comments: (Discussed risks of anesthesia with patient, including possibility of difficulty with spontaneous ventilation under anesthesia necessitating airway intervention, PONV, and rare risks such as cardiac or respiratory or neurological events, and allergic reactions. Discussed the role of CRNA in patient's perioperative care. Patient understands.)        Anesthesia Quick Evaluation

## 2021-12-20 NOTE — H&P (Signed)
Outpatient short stay form Pre-procedure 12/20/2021  Lesly Rubenstein, MD  Primary Physician: Dion Body, MD  Reason for visit:  Surveillance colonoscopy  History of present illness:    81 y/o gentleman with history of hypertension, DM II, and HLD here for surveillance colonoscopy. Last colonoscopy was in 2017 with two small Ta's. Father with colon cancer in his 43's. No blood thinners. No significant abdominal surgeries.    Current Facility-Administered Medications:    0.9 %  sodium chloride infusion, , Intravenous, Continuous, Emran Molzahn, Hilton Cork, MD, Last Rate: 20 mL/hr at 12/20/21 1100, Continued from Pre-op at 12/20/21 1100  Medications Prior to Admission  Medication Sig Dispense Refill Last Dose   amLODipine (NORVASC) 10 MG tablet Take 10 mg by mouth daily.    12/19/2021   aspirin EC 81 MG tablet Take 81 mg by mouth daily.    Past Week   atorvastatin (LIPITOR) 80 MG tablet TAKE 1/2 TABLET BY MOUTH EVERY NIGHT  1 12/19/2021   Blood Glucose Monitoring Suppl (ONE TOUCH ULTRA 2) W/DEVICE KIT USE AS DIRECTED. DX: E11.22, N18.2  0 12/19/2021   Cholecalciferol 25 MCG (1000 UT) tablet Take 2,000 Units by mouth daily.    Past Week   Coenzyme Q10 (CO Q-10) 100 MG CAPS Take 1 capsule by mouth daily.    Past Week   dapagliflozin propanediol (FARXIGA) 10 MG TABS tablet Take by mouth.   12/19/2021   glimepiride (AMARYL) 1 MG tablet TAKE 1 TABLET (1 MG TOTAL) BY MOUTH DAILY WITH BREAKFAST.  3 12/19/2021   hydrochlorothiazide (HYDRODIURIL) 12.5 MG tablet Take 12.5 mg by mouth daily.   12/19/2021   hydroxychloroquine (PLAQUENIL) 200 MG tablet Take 100 mg by mouth daily.    12/19/2021   losartan (COZAAR) 50 MG tablet Take 1 tablet by mouth 2 (two) times daily.   12/19/2021   ONE TOUCH ULTRA TEST test strip USE AS DIRECTED. CHECK CBG'S FASTING ONCE DAILY. DX: E11.22, N18.2  11 1/61/0960   ONETOUCH DELICA LANCETS FINE MISC Use 1 Units as directed. Check CBG's fasting once daily. Dx: E11.22, N18.2    12/19/2021   Turmeric 400 MG CAPS Take 1 capsule by mouth 2 (two) times a day.    Past Week   vitamin B-12 (CYANOCOBALAMIN) 1000 MCG tablet Take 1,000 mcg by mouth daily.   Past Week   acetaminophen (TYLENOL) 650 MG CR tablet Take 1,300 mg by mouth daily as needed for pain.    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 Evorn Gong    Chronic renal insufficiency    Diabetes mellitus without complication ([place])    Hypercholesteremia    Hypertension    Osteoarthritis    Prostate cancer ([place])    Squamous cell carcinoma    TIA (transient ischemic attack)     Review of systems:  Otherwise negative.    Physical Exam  Gen: Alert, oriented. Appears stated age.  HEENT: PERRLA. Lungs: No respiratory distress CV: RRR Abd: soft, benign, no masses Ext: No edema    Planned procedures: Proceed with colonoscopy. The patient understands the nature of the planned procedure, indications, risks, alternatives and  potential complications including but not limited to bleeding, infection, perforation, damage to internal organs and possible oversedation/side effects from anesthesia. The patient agrees and gives consent to proceed.  Please refer to procedure notes for findings, recommendations and patient disposition/instructions.     Lesly Rubenstein, MD [facility]

## 2021-12-20 NOTE — Interval H&P Note (Signed)
History and Physical Interval Note:  12/20/2021 11:29 AM  James Watson  has presented today for surgery, with the diagnosis of Z86.010  - Hx of adenomatous colonic polyps.  The various methods of treatment have been discussed with the patient and family. After consideration of risks, benefits and other options for treatment, the patient has consented to  Procedure(s) with comments: COLONOSCOPY WITH PROPOFOL (N/A) - DM as a surgical intervention.  The patient's history has been reviewed, patient examined, no change in status, stable for surgery.  I have reviewed the patient's chart and labs.  Questions were answered to the patient's satisfaction.     Lesly Rubenstein  Ok to proceed with colonoscopy

## 2021-12-20 NOTE — Anesthesia Procedure Notes (Signed)
Date/Time: 12/20/2021 11:32 AM  Performed by: Donalda Ewings, CindyPre-anesthesia Checklist: Patient identified, Emergency Drugs available, Suction available, Patient being monitored and Timeout performed Patient Re-evaluated:Patient Re-evaluated prior to induction Oxygen Delivery Method: Nasal cannula Preoxygenation: Pre-oxygenation with 100% oxygen Induction Type: IV induction Placement Confirmation: positive ETCO2 and CO2 detector

## 2021-12-20 NOTE — Op Note (Signed)
[facility] Gastroenterology Patient Name: James Watson Procedure Date: 12/20/2021 11:13 AM MRN: [id number] Account #: [id number] Date of Birth: January 15, 1941 Admit Type: Outpatient Age: 81 Room: [facility] ENDO ROOM 3 Gender: Male Note Status: Finalized Instrument Name: Jasper Riling [id number] Procedure:             Colonoscopy Indications:           Surveillance: Personal history of adenomatous polyps                         on last colonoscopy > 5 years ago Providers:             Andrey Farmer MD, MD Referring MD:          Dion Body (Referring MD) Medicines:             Monitored Anesthesia Care Complications:         No immediate complications. Estimated blood loss:                         Minimal. Procedure:             Pre-Anesthesia Assessment:                        - Prior to the procedure, a History and Physical was                         performed, and patient medications and allergies were                         reviewed. The patient is competent. The risks and                         benefits of the procedure and the sedation options and                         risks were discussed with the patient. All questions                         were answered and informed consent was obtained.                         Patient identification and proposed procedure were                         verified by the physician, the nurse, the                         anesthesiologist, the anesthetist and the technician                         in the endoscopy suite. Mental Status Examination:                         alert and oriented. Airway Examination: normal                         oropharyngeal airway and neck mobility. Respiratory  Examination: clear to auscultation. CV Examination:                         normal. Prophylactic Antibiotics: The patient does not                         require prophylactic antibiotics. Prior                          Anticoagulants: The patient has taken no previous                         anticoagulant or antiplatelet agents except for                         aspirin. ASA Grade Assessment: II - A patient with                         mild systemic disease. After reviewing the risks and                         benefits, the patient was deemed in satisfactory                         condition to undergo the procedure. The anesthesia                         plan was to use monitored anesthesia care (MAC).                         Immediately prior to administration of medications,                         the patient was re-assessed for adequacy to receive                         sedatives. The heart rate, respiratory rate, oxygen                         saturations, blood pressure, adequacy of pulmonary                         ventilation, and response to care were monitored                         throughout the procedure. The physical status of the                         patient was re-assessed after the procedure.                        After obtaining informed consent, the colonoscope was                         passed under direct vision. Throughout the procedure,                         the patient's blood pressure, pulse, and oxygen  saturations were monitored continuously. The                         Colonoscope was introduced through the anus and                         advanced to the the cecum, identified by appendiceal                         orifice and ileocecal valve. The colonoscopy was                         performed without difficulty. The patient tolerated                         the procedure well. The quality of the bowel                         preparation was good. Findings:      The perianal and digital rectal examinations were normal.      A 3 mm polyp was found in the cecum. The polyp was sessile. The polyp       was removed with a cold snare.  Resection and retrieval were complete.       Estimated blood loss was minimal.      A 3 mm polyp was found in the transverse colon. The polyp was sessile.       The polyp was removed with a cold snare. Resection and retrieval were       complete. Estimated blood loss was minimal.      Two sessile polyps were found in the sigmoid colon. The polyps were 2 to       3 mm in size. These polyps were removed with a cold snare. Resection and       retrieval were complete. Estimated blood loss was minimal.      A few small-mouthed diverticula were found in the sigmoid colon.      Internal hemorrhoids were found during retroflexion. The hemorrhoids       were Grade I (internal hemorrhoids that do not prolapse).      The exam was otherwise without abnormality on direct and retroflexion       views. Impression:            - One 3 mm polyp in the cecum, removed with a cold                         snare. Resected and retrieved.                        - One 3 mm polyp in the transverse colon, removed with                         a cold snare. Resected and retrieved.                        - Two 2 to 3 mm polyps in the sigmoid colon, removed                         with a cold snare. Resected and retrieved.                        -  Diverticulosis in the sigmoid colon.                        - Internal hemorrhoids.                        - The examination was otherwise normal on direct and                         retroflexion views. Recommendation:        - Discharge patient to home.                        - Resume previous diet.                        - Continue present medications.                        - Await pathology results.                        - Repeat colonoscopy is not recommended due to current                         age (50 years or older) for surveillance.                        - Return to referring physician as previously                         scheduled. Procedure Code(s):     ---  Professional ---                        [phone], Colonoscopy, flexible; with removal of                         tumor(s), polyp(s), or other lesion(s) by snare                         technique Diagnosis Code(s):     --- Professional ---                        K63.5, Polyp of colon                        Z86.010, Personal history of colonic polyps                        K64.0, First degree hemorrhoids                        K57.30, Diverticulosis of large intestine without                         perforation or abscess without bleeding CPT copyright 2019 American Medical Association. All rights reserved. The codes documented in this report are preliminary and upon coder review may  be revised to meet current compliance requirements. Andrey Farmer MD, MD 12/20/2021 11:57:16 AM Number of Addenda: 0 Note Initiated On: 12/20/2021 11:13 AM Scope Withdrawal Time: 0 hours 10 minutes  52 seconds  Total Procedure Duration: 0 hours 17 minutes 50 seconds  Estimated Blood Loss:  Estimated blood loss was minimal.      [facility]

## 2021-12-20 NOTE — Anesthesia Postprocedure Evaluation (Signed)
Anesthesia Post Note  Patient: James Watson  Procedure(s) Performed: COLONOSCOPY WITH PROPOFOL  Patient location during evaluation: Endoscopy Anesthesia Type: General Level of consciousness: awake and alert Pain management: pain level controlled Vital Signs Assessment: post-procedure vital signs reviewed and stable Respiratory status: spontaneous breathing, nonlabored ventilation, respiratory function stable and patient connected to nasal cannula oxygen Cardiovascular status: blood pressure returned to baseline and stable Postop Assessment: no apparent nausea or vomiting Anesthetic complications: no   No notable events documented.   Last Vitals:  Vitals:   12/20/21 1156 12/20/21 1206  BP: (!) 83/64 92/68  Pulse: (!) 53 (!) 50  Resp: 20 18  Temp: (!) 35.7 C   SpO2: 95% 96%    Last Pain:  Vitals:   12/20/21 1206  TempSrc:   PainSc: [place]

## 2021-12-21 ENCOUNTER — Encounter: Payer: Self-pay | Admitting: Gastroenterology

## 2021-12-21 LAB — SURGICAL PATHOLOGY

## 2022-01-19 ENCOUNTER — Inpatient Hospital Stay: Payer: Medicare Other

## 2022-01-19 ENCOUNTER — Inpatient Hospital Stay: Payer: Medicare Other | Attending: Oncology

## 2022-01-19 DIAGNOSIS — D751 Secondary polycythemia: Secondary | ICD-10-CM | POA: Diagnosis present

## 2022-01-19 LAB — HEMOGLOBIN AND HEMATOCRIT, BLOOD
HCT: 51.5 % (ref 39.0–52.0)
Hemoglobin: 17.4 g/dL — ABNORMAL HIGH (ref 13.0–17.0)

## 2022-01-19 NOTE — Progress Notes (Signed)
HCT is < 42; No phlebotomy required as HCT 41.5. Discussed results with patient. Pt comfortable with no phlebotomy today.

## 2022-06-10 ENCOUNTER — Encounter: Payer: Self-pay | Admitting: Urgent Care

## 2022-06-17 ENCOUNTER — Inpatient Hospital Stay: Payer: Medicare Other | Attending: Oncology

## 2022-06-17 ENCOUNTER — Inpatient Hospital Stay: Payer: Medicare Other

## 2022-06-17 ENCOUNTER — Encounter: Payer: Self-pay | Admitting: Urgent Care

## 2022-06-17 ENCOUNTER — Encounter: Payer: Self-pay | Admitting: Oncology

## 2022-06-17 ENCOUNTER — Inpatient Hospital Stay (HOSPITAL_BASED_OUTPATIENT_CLINIC_OR_DEPARTMENT_OTHER): Payer: Medicare Other | Admitting: Oncology

## 2022-06-17 VITALS — BP 123/86 | HR 68 | Temp 98.5°F | Resp 18 | Wt 176.7 lb

## 2022-06-17 DIAGNOSIS — D472 Monoclonal gammopathy: Secondary | ICD-10-CM

## 2022-06-17 DIAGNOSIS — D751 Secondary polycythemia: Secondary | ICD-10-CM | POA: Insufficient documentation

## 2022-06-17 DIAGNOSIS — N183 Chronic kidney disease, stage 3 unspecified: Secondary | ICD-10-CM | POA: Diagnosis not present

## 2022-06-17 DIAGNOSIS — N1832 Chronic kidney disease, stage 3b: Secondary | ICD-10-CM

## 2022-06-17 LAB — CBC WITH DIFFERENTIAL/PLATELET
Abs Immature Granulocytes: 0.01 10*3/uL (ref 0.00–0.07)
Basophils Absolute: 0.1 10*3/uL (ref 0.0–0.1)
Basophils Relative: 1 %
Eosinophils Absolute: 0.2 10*3/uL (ref 0.0–0.5)
Eosinophils Relative: 5 %
HCT: 52.9 % — ABNORMAL HIGH (ref 39.0–52.0)
Hemoglobin: 18.3 g/dL — ABNORMAL HIGH (ref 13.0–17.0)
Immature Granulocytes: 0 %
Lymphocytes Relative: 21 %
Lymphs Abs: 0.9 10*3/uL (ref 0.7–4.0)
MCH: 32.1 pg (ref 26.0–34.0)
MCHC: 34.6 g/dL (ref 30.0–36.0)
MCV: 92.8 fL (ref 80.0–100.0)
Monocytes Absolute: 0.5 10*3/uL (ref 0.1–1.0)
Monocytes Relative: 10 %
Neutro Abs: 2.7 10*3/uL (ref 1.7–7.7)
Neutrophils Relative %: 63 %
Platelets: 168 10*3/uL (ref 150–400)
RBC: 5.7 MIL/uL (ref 4.22–5.81)
RDW: 12.7 % (ref 11.5–15.5)
WBC: 4.4 10*3/uL (ref 4.0–10.5)
nRBC: 0 % (ref 0.0–0.2)

## 2022-06-17 LAB — COMPREHENSIVE METABOLIC PANEL
ALT: 25 U/L (ref 0–44)
AST: 34 U/L (ref 15–41)
Albumin: 4.2 g/dL (ref 3.5–5.0)
Alkaline Phosphatase: 56 U/L (ref 38–126)
Anion gap: 12 (ref 5–15)
BUN: 31 mg/dL — ABNORMAL HIGH (ref 8–23)
CO2: 21 mmol/L — ABNORMAL LOW (ref 22–32)
Calcium: 9 mg/dL (ref 8.9–10.3)
Chloride: 104 mmol/L (ref 98–111)
Creatinine, Ser: 2.15 mg/dL — ABNORMAL HIGH (ref 0.61–1.24)
GFR, Estimated: 30 mL/min — ABNORMAL LOW (ref >60)
Glucose, Bld: 151 mg/dL — ABNORMAL HIGH (ref 70–99)
Potassium: 3.6 mmol/L (ref 3.5–5.1)
Sodium: 137 mmol/L (ref 135–145)
Total Bilirubin: 0.7 mg/dL (ref 0.3–1.2)
Total Protein: 6.5 g/dL (ref 6.5–8.1)

## 2022-06-17 NOTE — Patient Instructions (Signed)

## 2022-06-17 NOTE — Progress Notes (Signed)
Pt here for follow up. No new concerns voiced.   

## 2022-06-20 LAB — KAPPA/LAMBDA LIGHT CHAINS
Kappa free light chain: 36.1 mg/L — ABNORMAL HIGH (ref 3.3–19.4)
Kappa, lambda light chain ratio: 2.8 — ABNORMAL HIGH (ref 0.26–1.65)
Lambda free light chains: 12.9 mg/L (ref 5.7–26.3)

## 2022-06-22 NOTE — Progress Notes (Signed)
Hematology/Oncology Progress Note [phone] [phone]    Chief Complaint: James Watson is a 82 y.o. 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 Multiple myeloma panel is negative for M protein.  light chain ratio 2.8, slightly increased.  Continue observation.  Repeat labs in 6 months.  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 Labs reviewed and discussed with patient.   Recommend patient to have sleep study done phlebotomy 300 cc if hematocrit is above 52.  CKD (chronic kidney disease) stage 3, GFR 30-59 ml/min (HCC) Encourage oral hydration and avoid nephrotoxins.    Orders Placed This Encounter  Procedures   CBC with Differential/Platelet    Standing Status:   Future    Standing Expiration Date:   06/18/2023   Comprehensive metabolic panel    Standing Status:   Future    Standing Expiration Date:   06/17/2023   Multiple Myeloma Panel (SPEP&IFE w/QIG)    Standing Status:   Future    Standing Expiration Date:   06/17/2023   Kappa/lambda light chains    Standing Status:   Future    Standing Expiration Date:   06/17/2023    All questions were answered. The patient knows to call the clinic with any problems, questions or concerns.  Earlie Server, MD, PhD [facility] Hematology Oncology 06/17/2022   INTERVAL HISTORY James Watson is a 82 y.o. male who has above history reviewed by me today presents for follow up visit for erythrocytosis, thrombocytopenia. Problems and complaints are listed below: Patient reports feeling well.  he is a retired Pharmacist, community. Patient previously followed up by Dr.Corcoran, patient switched care to me on 06/22/22 Extensive medical record review was performed by me  No  new complaints  He has chronic kidney disease.  Myeloma panel on 04/06/2016 revealed no monoclonal protein and normal immunoglobulins. SPEP on 04/17/2018 revealed no monoclonal protein.   Light chain ratio 2.29. IFE urine on 04/19/2017 revealed 618 mg protein in 24 hours (30-150).   24 hour UPEP revealed no monoclonal protein.  There was 406 mg protein/24 hours. 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  He has history of erythrocytosis.  Hemoglobin was 16.6 on 05/03/2018.  He denies any cardiopulmonary disease, tobacco use, sleep apnea or testosterone use.  CXR on 05/08/2018 revealed no acute cardiopulmonary abnormalities.  04/17/2018 patient has normal erythropoietin level 12.8, normal carbo monoxide level, JAK2 V617F mutation negative, with reflex to other mutations CALR, MPL, JAK 2 Ex 12-15 mutations negative. He was advised by Dr. Mike Gip to have 300 cc phlebotomy every 3 months if hemoglobin is above 16.5.  He had a negative peripheral blood work-up for primary myeloproliferative disease.  INTERVAL HISTORY James Watson is a 82 y.o. male who has above history reviewed by me today presents for follow up visit for Bence-Jones protein in urine, chronic thrombocytopenia, renal insufficiency, and erythrocytosis  Patient reports feeling well.  No new complaints.  Review of Systems  Constitutional:  Negative for appetite change, chills, fatigue, fever and unexpected weight change.  HENT:   Negative for hearing loss and voice change.   Eyes:  Negative for eye problems and icterus.  Respiratory:  Negative for chest tightness, cough and shortness of breath.   Cardiovascular:  Negative for chest pain and  leg swelling.  Gastrointestinal:  Negative for abdominal distention and abdominal pain.  Endocrine: Negative for hot flashes.  Genitourinary:  Negative for difficulty urinating, dysuria and frequency.   Musculoskeletal:  Negative for arthralgias.  Skin:  Negative for itching and rash.   Neurological:  Negative for light-headedness and numbness.  Hematological:  Negative for adenopathy. Does not bruise/bleed easily.  Psychiatric/Behavioral:  Negative for confusion.     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 Evorn Gong    Chronic renal insufficiency    Diabetes mellitus without complication ([place])    Hypercholesteremia    Hypertension    Osteoarthritis    Prostate cancer ([place])    Squamous cell carcinoma    TIA (transient ischemic attack)     Past Surgical History:  Procedure Laterality Date   CATARACT EXTRACTION W/PHACO Left 01/30/2019   Procedure: CATARACT EXTRACTION PHACO AND INTRAOCULAR LENS PLACEMENT ([place]) left diabetic  panoptix lens  00:59.4  15.3  9.09;  Surgeon: Leandrew Koyanagi, MD;  Location: [place];  Service: Ophthalmology;  Laterality: Left;  Diabetic - oral meds   CATARACT EXTRACTION W/PHACO Right 02/20/2019   Procedure: CATARACT EXTRACTION PHACO AND INTRAOCULAR LENS PLACEMENT (IOC) RIGHT DIABETIC PANOPTIX TORIC  0 ;54 11.0% 6.01;  Surgeon: Leandrew Koyanagi, MD;  Location: [place];  Service: Ophthalmology;  Laterality: Right;   COLONOSCOPY WITH PROPOFOL N/A 07/16/2015   Procedure: COLONOSCOPY WITH PROPOFOL;  Surgeon: Manya Silvas, MD;  Location: [facility] ENDOSCOPY;  Service: Endoscopy;  Laterality: N/A;   COLONOSCOPY WITH PROPOFOL N/A 12/20/2021   Procedure: COLONOSCOPY WITH PROPOFOL;  Surgeon: Lesly Rubenstein, MD;  Location: [facility] ENDOSCOPY;  Service: Endoscopy;  Laterality: N/A;  DM   EYE SURGERY     left shoulder arthroscopy     mtp repair     PROSTATECTOMY     tm repair      History reviewed. No pertinent family history.  Social History:  reports that he has never smoked. He has never used smokeless tobacco. He reports current alcohol use of about 2.0 standard drinks of alcohol per week. He reports that he  does not use drugs.  Allergies:  Allergies  Allergen Reactions   Enalapril Maleate Other (See Comments)    Difficulty breathing   Hydrocodone Other (See Comments)   Hydrocodone-Acetaminophen Nausea Only    Hallucinations    Current Medications: Current Outpatient Medications  Medication Sig Dispense Refill   acetaminophen (TYLENOL) 650 MG CR tablet Take 1,300 mg by mouth daily as needed for pain.     amLODipine (NORVASC) 10 MG tablet Take 10 mg by mouth daily.      aspirin EC 81 MG tablet Take 81 mg by mouth daily.      atorvastatin (LIPITOR) 80 MG tablet TAKE 1/2 TABLET BY MOUTH EVERY NIGHT  1   Blood Glucose Monitoring Suppl (ONE TOUCH ULTRA 2) W/DEVICE KIT USE AS DIRECTED. DX: E11.22, N18.2  0   Cholecalciferol 25 MCG (1000 UT) tablet Take 2,000 Units by mouth daily.      Coenzyme Q10 (CO Q-10) 100 MG CAPS Take 1 capsule by mouth daily.      dapagliflozin propanediol (FARXIGA) 10 MG TABS tablet Take by mouth.     glimepiride (AMARYL) 1 MG tablet TAKE 1 TABLET (1 MG TOTAL) BY MOUTH DAILY WITH BREAKFAST.  3   hydrochlorothiazide (HYDRODIURIL) 12.5 MG  tablet Take 12.5 mg by mouth daily.     hydroxychloroquine (PLAQUENIL) 200 MG tablet Take 100 mg by mouth daily.      losartan (COZAAR) 50 MG tablet Take 1 tablet by mouth 2 (two) times daily.     ONE TOUCH ULTRA TEST test strip USE AS DIRECTED. CHECK CBG'S FASTING ONCE DAILY. DX: E11.22, N18.2  11   ONETOUCH DELICA LANCETS FINE MISC Use 1 Units as directed. Check CBG's fasting once daily. Dx: E11.22, N18.2     Turmeric 400 MG CAPS Take 1 capsule by mouth 2 (two) times a day.      vitamin B-12 (CYANOCOBALAMIN) 1000 MCG tablet Take 1,000 mcg by mouth daily.     No current facility-administered medications for this visit.     Performance status (ECOG): 1  Vitals Blood pressure 123/86, pulse 68, temperature 98.5 F (36.9 C), resp. rate 18, weight 176 lb 11.2 oz (80.2 kg).   Physical Exam Constitutional:      General: He is not in  acute distress.    Appearance: Normal appearance.  HENT:     Head: Normocephalic.     Nose: Nose normal.     Mouth/Throat:     Pharynx: No oropharyngeal exudate.  Eyes:     General: No scleral icterus.    Pupils: Pupils are equal, round, and reactive to light.  Cardiovascular:     Rate and Rhythm: Normal rate.  Pulmonary:     Effort: Pulmonary effort is normal. No respiratory distress.  Abdominal:     General: Bowel sounds are normal. There is no distension.     Palpations: Abdomen is soft.  Musculoskeletal:        General: Normal range of motion.     Cervical back: Normal range of motion.  Skin:    General: Skin is warm and dry.     Findings: No erythema.  Neurological:     Mental Status: He is alert and oriented to person, place, and time. Mental status is at baseline.     Cranial Nerves: No cranial nerve deficit.     Motor: No abnormal muscle tone.  Psychiatric:        Mood and Affect: Mood and affect normal.      Labs are reviewed by me    Latest Ref Rng & Units 06/17/2022   11:34 AM 01/19/2022    2:34 PM 11/29/2021   10:05 AM  CBC  WBC 4.0 - 10.5 K/uL 4.4   4.6   Hemoglobin 13.0 - 17.0 g/dL 18.3  17.4  18.4   Hematocrit 39.0 - 52.0 % 52.9  51.5  54.8   Platelets 150 - 400 K/uL 168   158       Latest Ref Rng & Units 06/17/2022   11:34 AM 11/29/2021   10:05 AM 04/17/2018   11:09 AM  CMP  Glucose 70 - 99 mg/dL 151  166  127   BUN 8 - 23 mg/dL 31  33  26   Creatinine 0.61 - 1.24 mg/dL 2.15  2.29  1.72   Sodium 135 - 145 mmol/L 137  138  135   Potassium 3.5 - 5.1 mmol/L 3.6  3.7  3.8   Chloride 98 - 111 mmol/L 104  103  101   CO2 22 - 32 mmol/L '21  27  27   '$ Calcium 8.9 - 10.3 mg/dL 9.0  8.8  9.3   Total Protein 6.5 - 8.1 g/dL 6.5  6.7  7.0  Total Bilirubin 0.3 - 1.2 mg/dL 0.7  0.9  0.7   Alkaline Phos 38 - 126 U/L 56  57  65   AST 15 - 41 U/L 34  26  36   ALT 0 - 44 U/L 25  22  36

## 2022-06-23 DIAGNOSIS — N183 Chronic kidney disease, stage 3 unspecified: Secondary | ICD-10-CM | POA: Insufficient documentation

## 2022-06-23 DIAGNOSIS — D472 Monoclonal gammopathy: Secondary | ICD-10-CM | POA: Insufficient documentation

## 2022-06-23 LAB — MULTIPLE MYELOMA PANEL, SERUM
Albumin SerPl Elph-Mcnc: 4 g/dL (ref 2.9–4.4)
Albumin/Glob SerPl: 1.8 — ABNORMAL HIGH (ref 0.7–1.7)
Alpha 1: 0.2 g/dL (ref 0.0–0.4)
Alpha2 Glob SerPl Elph-Mcnc: 0.6 g/dL (ref 0.4–1.0)
B-Globulin SerPl Elph-Mcnc: 0.9 g/dL (ref 0.7–1.3)
Gamma Glob SerPl Elph-Mcnc: 0.7 g/dL (ref 0.4–1.8)
Globulin, Total: 2.3 g/dL (ref 2.2–3.9)
IgA: 192 mg/dL (ref 61–437)
IgG (Immunoglobin G), Serum: 619 mg/dL (ref 603–1613)
IgM (Immunoglobulin M), Srm: 51 mg/dL (ref 15–143)
Total Protein ELP: 6.3 g/dL (ref 6.0–8.5)

## 2022-06-23 NOTE — Assessment & Plan Note (Addendum)
JAK2 V617F mutation negative, with reflex to other mutations CALR, MPL, JAK 2 Ex 12-15 mutations negative. Labs reviewed and discussed with patient.   Recommend patient to have sleep study done phlebotomy 300 cc if hematocrit is above 52.

## 2022-06-23 NOTE — Assessment & Plan Note (Signed)
Encourage oral hydration and avoid nephrotoxins.   

## 2022-06-23 NOTE — Assessment & Plan Note (Addendum)
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 Multiple myeloma panel is negative for M protein.  light chain ratio 2.8, slightly increased.  Continue observation.  Repeat labs in 6 months.

## 2022-06-26 ENCOUNTER — Encounter: Payer: Self-pay | Admitting: Urgent Care

## 2022-06-27 ENCOUNTER — Telehealth: Payer: Self-pay

## 2022-06-27 NOTE — Telephone Encounter (Signed)
-----  Message from Earlie Server, MD sent at 06/26/2022 10:56 PM EST ----- Please add phlebotomy to his next visit with me. Thank you

## 2022-06-28 ENCOUNTER — Other Ambulatory Visit: Payer: Self-pay | Admitting: Family Medicine

## 2022-06-28 DIAGNOSIS — Z0000 Encounter for general adult medical examination without abnormal findings: Secondary | ICD-10-CM

## 2022-06-28 DIAGNOSIS — Z8249 Family history of ischemic heart disease and other diseases of the circulatory system: Secondary | ICD-10-CM

## 2022-07-12 ENCOUNTER — Inpatient Hospital Stay: Payer: Medicare Other | Attending: Oncology

## 2022-07-12 ENCOUNTER — Other Ambulatory Visit: Payer: Self-pay

## 2022-07-12 DIAGNOSIS — D472 Monoclonal gammopathy: Secondary | ICD-10-CM | POA: Diagnosis present

## 2022-07-12 DIAGNOSIS — D751 Secondary polycythemia: Secondary | ICD-10-CM | POA: Insufficient documentation

## 2022-07-13 ENCOUNTER — Ambulatory Visit
Admission: RE | Admit: 2022-07-13 | Discharge: 2022-07-13 | Disposition: A | Discharge status: Home / Self Care | Payer: Medicare Other | Source: Ambulatory Visit | Attending: Family Medicine | Admitting: Family Medicine

## 2022-07-13 DIAGNOSIS — Z8249 Family history of ischemic heart disease and other diseases of the circulatory system: Secondary | ICD-10-CM | POA: Insufficient documentation

## 2022-07-13 DIAGNOSIS — Z0000 Encounter for general adult medical examination without abnormal findings: Secondary | ICD-10-CM | POA: Diagnosis present

## 2022-07-13 DIAGNOSIS — I7121 Aneurysm of the ascending aorta, without rupture: Secondary | ICD-10-CM | POA: Diagnosis not present

## 2022-07-15 ENCOUNTER — Ambulatory Visit: Payer: Medicare Other | Admitting: Urology

## 2022-07-15 ENCOUNTER — Encounter: Payer: Self-pay | Admitting: Urology

## 2022-07-15 VITALS — BP 135/84 | HR 60 | Ht 65.0 in | Wt 170.0 lb

## 2022-07-15 DIAGNOSIS — N3281 Overactive bladder: Secondary | ICD-10-CM

## 2022-07-15 DIAGNOSIS — R351 Nocturia: Secondary | ICD-10-CM

## 2022-07-15 LAB — IFE+PROTEIN ELECTRO, 24-HR UR
% BETA, Urine: 7.6 %
ALPHA 1 URINE: 3.4 %
Albumin, U: 80.4 %
Alpha 2, Urine: 3.3 %
GAMMA GLOBULIN URINE: 5.3 %
Total Protein, Urine-Ur/day: 660 mg/24 hr — ABNORMAL HIGH (ref 30–150)
Total Protein, Urine: 21.3 mg/dL
Total Volume: 3100

## 2022-07-15 MED ORDER — MIRABEGRON ER 50 MG PO TB24: 50.0000 mg | ORAL_TABLET | Freq: Every day | ORAL | 0 refills | Status: DC

## 2022-07-15 NOTE — Progress Notes (Signed)
07/15/2022 1:07 PM   James Watson [birth year] [id number]  Referring provider: Dion Body, MD [facility] [place],  [place] [address]  Chief Complaint  Patient presents with   Other    HPI: James Watson is a 82 y.o. male referred for evaluation of storage related voiding symptoms.  History of prostate cancer status post laparoscopic radical prostatectomy at [facility] in 2001 Persistent urinary postop requiring prolonged catheterization Since that time he has had sore delayed voiding symptoms which have recently worsened.  He complains of urinary frequency, urgency with occasional episodes of urge incontinence.  He voids with an excellent stream.  No history recurrent UTI.  Denies gross hematuria.  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 Evorn Gong    Chronic renal insufficiency    Diabetes mellitus without complication ([place])    Hypercholesteremia    Hypertension    Osteoarthritis    Prostate cancer ([place])    Squamous cell carcinoma    TIA (transient ischemic attack)     Surgical History: Past Surgical History:  Procedure Laterality Date   CATARACT EXTRACTION W/PHACO Left 01/30/2019   Procedure: CATARACT EXTRACTION PHACO AND INTRAOCULAR LENS PLACEMENT ([place]) left diabetic  panoptix lens  00:59.4  15.3  9.09;  Surgeon: Leandrew Koyanagi, MD;  Location: [place];  Service: Ophthalmology;  Laterality: Left;  Diabetic - oral meds   CATARACT EXTRACTION W/PHACO Right 02/20/2019   Procedure: CATARACT EXTRACTION PHACO AND INTRAOCULAR LENS PLACEMENT ([facility]) RIGHT DIABETIC PANOPTIX TORIC  0 ;54 11.0% 6.01;  Surgeon: Leandrew Koyanagi, MD;  Location: [place];  Service: Ophthalmology;  Laterality: Right;   COLONOSCOPY WITH PROPOFOL N/A 07/16/2015   Procedure: COLONOSCOPY WITH  PROPOFOL;  Surgeon: Manya Silvas, MD;  Location: [facility] ENDOSCOPY;  Service: Endoscopy;  Laterality: N/A;   COLONOSCOPY WITH PROPOFOL N/A 12/20/2021   Procedure: COLONOSCOPY WITH PROPOFOL;  Surgeon: Lesly Rubenstein, MD;  Location: [facility] ENDOSCOPY;  Service: Endoscopy;  Laterality: N/A;  DM   EYE SURGERY     left shoulder arthroscopy     mtp repair     PROSTATECTOMY     tm repair      Home Medications:  Allergies as of 07/15/2022       Reactions   Enalapril Maleate Other (See Comments)   Difficulty breathing   Hydrocodone Other (See Comments)   Hydrocodone-acetaminophen Nausea Only   Hallucinations        Medication List        Accurate as of July 15, 2022 11:59 PM. If you have any questions, ask your nurse or doctor.          acetaminophen 650 MG CR tablet Commonly known as: TYLENOL Take 1,300 mg by mouth daily as needed for pain.   amLODipine 10 MG tablet Commonly known as: NORVASC Take 10 mg by mouth daily.   aspirin EC 81 MG tablet Take 81 mg by mouth daily.   atorvastatin 80 MG tablet Commonly known as: LIPITOR TAKE 1/2 TABLET BY MOUTH EVERY NIGHT   Cholecalciferol 25 MCG (1000 UT) tablet Take 2,000 Units by mouth daily.   Co Q-10 100 MG Caps Take 1 capsule by mouth daily.   cyanocobalamin 1000 MCG tablet Commonly known as: VITAMIN B12 Take 1,000 mcg by mouth daily.  Farxiga 10 MG Tabs tablet Generic drug: dapagliflozin propanediol Take by mouth.   glimepiride 1 MG tablet Commonly known as: AMARYL TAKE 1 TABLET (1 MG TOTAL) BY MOUTH DAILY WITH BREAKFAST.   hydrochlorothiazide 12.5 MG tablet Commonly known as: HYDRODIURIL Take 12.5 mg by mouth daily.   hydroxychloroquine 200 MG tablet Commonly known as: PLAQUENIL Take 100 mg by mouth daily.   losartan 50 MG tablet Commonly known as: COZAAR Take 1 tablet by mouth 2 (two) times daily.   mirabegron ER 50 MG Tb24 tablet Commonly known as: MYRBETRIQ Take 1 tablet (50 mg total) by  mouth daily. Started by: Abbie Sons, MD   ONE TOUCH ULTRA 2 w/Device Kit USE AS DIRECTED. DX: E11.22, N18.2   ONE TOUCH ULTRA TEST test strip Generic drug: glucose blood USE AS DIRECTED. CHECK CBG'S FASTING ONCE DAILY. DX: E11.22, [id number]   OneTouch Delica Lancets Fine Misc Use 1 Units as directed. Check CBG's fasting once daily. Dx: E11.22, N18.2   Turmeric 400 MG Caps Take 1 capsule by mouth 2 (two) times a day.        Allergies:  Allergies  Allergen Reactions   Enalapril Maleate Other (See Comments)    Difficulty breathing   Hydrocodone Other (See Comments)   Hydrocodone-Acetaminophen Nausea Only    Hallucinations    Family History: History reviewed. No pertinent family history.  Social History:  reports that he has never smoked. He has never used smokeless tobacco. He reports current alcohol use of about 2.0 standard drinks of alcohol per week. He reports that he does not use drugs.   Physical Exam: BP 135/84   Pulse 60   Ht 5' 5"$  (1.651 m)   Wt 170 lb (77.1 kg)   BMI 28.29 kg/m   Constitutional:  Alert and oriented, No acute distress. HEENT: [place] AT Respiratory: Normal respiratory effort, no increased work of breathing. Psychiatric: Normal mood and affect.  Laboratory Data:  Urinalysis Dipstick/microscopy negative   Assessment & Plan:    82 y.o. old male status post radical prostatectomy 20+ years ago with storage related voiding symptoms UA today clear Recommend beta 3 agonist trial and he was given samples of Myrbetriq 50 mg daily Will call back regarding efficacy   Abbie Sons, MD  [facility] [address], [place], [place] [address] [phone]

## 2022-07-16 LAB — URINALYSIS, COMPLETE
Bilirubin, UA: NEGATIVE
Ketones, UA: NEGATIVE
Leukocytes,UA: NEGATIVE
Nitrite, UA: NEGATIVE
RBC, UA: NEGATIVE
Specific Gravity, UA: <1.005 — ABNORMAL LOW (ref 1.005–1.030)
Urobilinogen, Ur: 0.2 mg/dL (ref 0.2–1.0)
pH, UA: 5.5 (ref 5.0–7.5)

## 2022-07-16 LAB — MICROSCOPIC EXAMINATION
Bacteria, UA: NONE SEEN
Epithelial Cells (non renal): NONE SEEN /hpf (ref 0–10)

## 2022-08-12 ENCOUNTER — Telehealth: Payer: Self-pay | Admitting: Urology

## 2022-08-12 MED ORDER — MIRABEGRON ER 50 MG PO TB24: 50.0000 mg | ORAL_TABLET | Freq: Every day | ORAL | 11 refills | Status: DC

## 2022-08-12 NOTE — Telephone Encounter (Signed)
Mrybetriq 50 sent to [facility]

## 2022-08-12 NOTE — Telephone Encounter (Signed)
Pt called and the samples Myrbetriq '50mg'$  are working great.  Pt would like a RX.  Pharmacy is [facility], [place].  Pt number [phone]

## 2022-09-01 ENCOUNTER — Encounter: Payer: Self-pay | Admitting: Oncology

## 2022-10-14 ENCOUNTER — Ambulatory Visit (LOCAL_COMMUNITY_HEALTH_CENTER): Payer: Self-pay

## 2022-10-14 DIAGNOSIS — Z719 Counseling, unspecified: Secondary | ICD-10-CM

## 2022-10-14 DIAGNOSIS — Z23 Encounter for immunization: Secondary | ICD-10-CM

## 2022-10-14 NOTE — Progress Notes (Signed)
Pt seen in nurse clinic requested Hep A vaccine before travelling to [place] next month. Eligible, administered vaccine and tolerated well. Given VIS and updated [facility] copy, informed of next vaccine visit. M.Alf Doyle, LPN.

## 2022-12-16 ENCOUNTER — Inpatient Hospital Stay: Payer: Medicare Other | Admitting: Oncology

## 2022-12-16 ENCOUNTER — Inpatient Hospital Stay: Payer: Medicare Other | Attending: Oncology

## 2022-12-16 ENCOUNTER — Encounter: Payer: Self-pay | Admitting: Oncology

## 2022-12-16 ENCOUNTER — Inpatient Hospital Stay: Payer: Medicare Other

## 2022-12-16 VITALS — BP 127/80 | HR 61 | Temp 97.0°F | Resp 17 | Wt 177.0 lb

## 2022-12-16 DIAGNOSIS — G4730 Sleep apnea, unspecified: Secondary | ICD-10-CM | POA: Diagnosis not present

## 2022-12-16 DIAGNOSIS — N1832 Chronic kidney disease, stage 3b: Secondary | ICD-10-CM | POA: Diagnosis not present

## 2022-12-16 DIAGNOSIS — N183 Chronic kidney disease, stage 3 unspecified: Secondary | ICD-10-CM | POA: Diagnosis not present

## 2022-12-16 DIAGNOSIS — D472 Monoclonal gammopathy: Secondary | ICD-10-CM

## 2022-12-16 DIAGNOSIS — D751 Secondary polycythemia: Secondary | ICD-10-CM | POA: Insufficient documentation

## 2022-12-16 LAB — CBC WITH DIFFERENTIAL/PLATELET
Abs Immature Granulocytes: 0.01 10*3/uL (ref 0.00–0.07)
Basophils Absolute: 0.1 10*3/uL (ref 0.0–0.1)
Basophils Relative: 2 %
Eosinophils Absolute: 0.2 10*3/uL (ref 0.0–0.5)
Eosinophils Relative: 7 %
HCT: 52.8 % — ABNORMAL HIGH (ref 39.0–52.0)
Hemoglobin: 17.7 g/dL — ABNORMAL HIGH (ref 13.0–17.0)
Immature Granulocytes: 0 %
Lymphocytes Relative: 21 %
Lymphs Abs: 0.7 10*3/uL (ref 0.7–4.0)
MCH: 32.1 pg (ref 26.0–34.0)
MCHC: 33.5 g/dL (ref 30.0–36.0)
MCV: 95.7 fL (ref 80.0–100.0)
Monocytes Absolute: 0.3 10*3/uL (ref 0.1–1.0)
Monocytes Relative: 8 %
Neutro Abs: 2.1 10*3/uL (ref 1.7–7.7)
Neutrophils Relative %: 62 %
Platelets: 163 10*3/uL (ref 150–400)
RBC: 5.52 MIL/uL (ref 4.22–5.81)
RDW: 13.2 % (ref 11.5–15.5)
WBC: 3.4 10*3/uL — ABNORMAL LOW (ref 4.0–10.5)
nRBC: 0 % (ref 0.0–0.2)

## 2022-12-16 LAB — COMPREHENSIVE METABOLIC PANEL
ALT: 21 U/L (ref 0–44)
AST: 25 U/L (ref 15–41)
Albumin: 4.2 g/dL (ref 3.5–5.0)
Alkaline Phosphatase: 62 U/L (ref 38–126)
Anion gap: 9 (ref 5–15)
BUN: 38 mg/dL — ABNORMAL HIGH (ref 8–23)
CO2: 25 mmol/L (ref 22–32)
Calcium: 8.9 mg/dL (ref 8.9–10.3)
Chloride: 101 mmol/L (ref 98–111)
Creatinine, Ser: 1.95 mg/dL — ABNORMAL HIGH (ref 0.61–1.24)
GFR, Estimated: 34 mL/min — ABNORMAL LOW (ref >60)
Glucose, Bld: 177 mg/dL — ABNORMAL HIGH (ref 70–99)
Potassium: 3.8 mmol/L (ref 3.5–5.1)
Sodium: 135 mmol/L (ref 135–145)
Total Bilirubin: 0.8 mg/dL (ref 0.3–1.2)
Total Protein: 7 g/dL (ref 6.5–8.1)

## 2022-12-16 NOTE — Assessment & Plan Note (Addendum)
JAK2 V617F mutation negative, with reflex to other mutations CALR, MPL, JAK 2 Ex 12-15 mutations negative. Labs reviewed and discussed with patient.   Secondary erythrocytosis, secondary to sleep apnea. Given that he is going to start with CPAP, I will hold off phlebotomy.

## 2022-12-16 NOTE — Assessment & Plan Note (Addendum)
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 Labs are reviewed and discussed with patient. Lab Results  Component Value Date   MPROTEIN Not Observed 06/17/2022   KPAFRELGTCHN 36.1 (H) 06/17/2022   LAMBDASER 12.9 06/17/2022   KAPLAMBRATIO 2.80 (H) 06/17/2022  Today's MGUS labs are pending.    Continue observation.  Repeat labs in 6 months.

## 2022-12-16 NOTE — Assessment & Plan Note (Addendum)
Encourage oral hydration and avoid nephrotoxins.   

## 2022-12-16 NOTE — Progress Notes (Signed)
Hematology/Oncology Progress Note [phone] Fax:([phone]) [phone]    Chief Complaint: James Watson is a 82 y.o. 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 Labs are reviewed and discussed with patient. Lab Results  Component Value Date   MPROTEIN Not Observed 06/17/2022   KPAFRELGTCHN 36.1 (H) 06/17/2022   LAMBDASER 12.9 06/17/2022   KAPLAMBRATIO 2.80 (H) 06/17/2022  Today's MGUS labs are pending.    Continue observation.  Repeat labs in 6 months.  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 Labs reviewed and discussed with patient.   Secondary erythrocytosis, secondary to sleep apnea. Given that he is going to start with CPAP, I will hold off phlebotomy.   CKD (chronic kidney disease) stage 3, GFR 30-59 ml/min (HCC) Encourage oral hydration and avoid nephrotoxins.    Orders Placed This Encounter  Procedures   CBC with Differential (Cancer Center Only)    Standing Status:   Future    Standing Expiration Date:   12/16/2023   CMP (Cancer Center only)    Standing Status:   Future    Standing Expiration Date:   12/16/2023   Multiple Myeloma Panel (SPEP&IFE w/QIG)    Standing Status:   Future    Standing Expiration Date:   12/16/2023   Kappa/lambda light chains    Standing Status:   Future    Standing Expiration Date:   12/16/2023   Follow-up in 6 months. All questions were answered. The patient knows to call the clinic with any problems, questions or concerns.  Rickard Patience, MD, PhD [facility] Hematology Oncology 12/16/2022   INTERVAL HISTORY James Watson is a 82 y.o. male who has above history reviewed by me today presents for follow up visit for erythrocytosis, thrombocytopenia. Problems and  complaints are listed below: Patient reports feeling well.  he is a retired Education officer, community. Patient previously followed up by Dr.Corcoran, patient switched care to me on 12/16/22 Extensive medical record review was performed by me  No new complaints  He has chronic kidney disease.  Myeloma panel on 04/06/2016 revealed no monoclonal protein and normal immunoglobulins. SPEP on 04/17/2018 revealed no monoclonal protein.   Light chain ratio 2.29. IFE urine on 04/19/2017 revealed 618 mg protein in 24 hours (30-150).   24 hour UPEP revealed no monoclonal protein.  There was 406 mg protein/24 hours. 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  He has history of erythrocytosis.  Hemoglobin was 16.6 on 05/03/2018.  He denies any cardiopulmonary disease, tobacco use, sleep apnea or testosterone use.  CXR on 05/08/2018 revealed no acute cardiopulmonary abnormalities.  04/17/2018 patient has normal erythropoietin level 12.8, normal carbo monoxide level, JAK2 V617F mutation negative, with reflex to other mutations CALR, MPL, JAK 2 Ex 12-15 mutations negative. He was advised by Dr. Merlene Pulling to have 300 cc phlebotomy every 3 months if hemoglobin is above 16.5.  He had a negative peripheral blood work-up for primary myeloproliferative disease.  INTERVAL HISTORY James Watson is a 82 y.o. male who has above history reviewed by me today presents for follow up visit for Bence-Jones protein in urine, chronic thrombocytopenia, renal insufficiency, and erythrocytosis  Patient reports feeling well.  No new complaints.  He was diagnosed with sleep apnea plans to start using CPAP machine  Review of Systems  Constitutional:  Negative for appetite change, chills, fatigue, fever and unexpected weight change.  HENT:   Negative for hearing loss and voice change.   Eyes:  Negative for eye problems and icterus.  Respiratory:  Negative for chest tightness, cough and shortness of breath.   Cardiovascular:  Negative for chest  pain and leg swelling.  Gastrointestinal:  Negative for abdominal distention and abdominal pain.  Endocrine: Negative for hot flashes.  Genitourinary:  Negative for difficulty urinating, dysuria and frequency.   Musculoskeletal:  Negative for arthralgias.  Skin:  Negative for itching and rash.  Neurological:  Negative for light-headedness and numbness.  Hematological:  Negative for adenopathy. Does not bruise/bleed easily.  Psychiatric/Behavioral:  Negative for confusion.     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 Adolphus Birchwood    Chronic renal insufficiency    Diabetes mellitus without complication (HCC)    Hypercholesteremia    Hypertension    Osteoarthritis    Prostate cancer (HCC)    Squamous cell carcinoma    TIA (transient ischemic attack)     Past Surgical History:  Procedure Laterality Date   CATARACT EXTRACTION W/PHACO Left 01/30/2019   Procedure: CATARACT EXTRACTION PHACO AND INTRAOCULAR LENS PLACEMENT (IOC) left diabetic  panoptix lens  00:59.4  15.3  9.09;  Surgeon: Lockie Mola, MD;  Location: [facility];  Service: Ophthalmology;  Laterality: Left;  Diabetic - oral meds   CATARACT EXTRACTION W/PHACO Right 02/20/2019   Procedure: CATARACT EXTRACTION PHACO AND INTRAOCULAR LENS PLACEMENT (IOC) RIGHT DIABETIC PANOPTIX TORIC  0 ;54 11.0% 6.01;  Surgeon: Lockie Mola, MD;  Location: [facility];  Service: Ophthalmology;  Laterality: Right;   COLONOSCOPY WITH PROPOFOL N/A 07/16/2015   Procedure: COLONOSCOPY WITH PROPOFOL;  Surgeon: Scot Jun, MD;  Location: [facility];  Service: Endoscopy;  Laterality: N/A;   COLONOSCOPY WITH PROPOFOL N/A 12/20/2021   Procedure: COLONOSCOPY WITH PROPOFOL;  Surgeon: Regis Bill, MD;  Location: [facility] ENDOSCOPY;  Service: Endoscopy;  Laterality: N/A;  DM   EYE SURGERY     left shoulder  arthroscopy     mtp repair     PROSTATECTOMY     tm repair      History reviewed. No pertinent family history.  Social History:  reports that he has never smoked. He has never used smokeless tobacco. He reports current alcohol use of about 2.0 standard drinks of alcohol per week. He reports that he does not use drugs.  Allergies:  Allergies  Allergen Reactions   Enalapril Maleate Other (See Comments)    Difficulty breathing   Hydrocodone Other (See Comments)   Hydrocodone-Acetaminophen Nausea Only    Hallucinations    Current Medications: Current Outpatient Medications  Medication Sig Dispense Refill   acetaminophen (TYLENOL) 650 MG CR tablet Take 1,300 mg by mouth daily as needed for pain.     amLODipine (NORVASC) 10 MG tablet Take 10 mg by mouth daily.      aspirin EC 81 MG tablet Take 81 mg by mouth daily.      atorvastatin (LIPITOR) 80 MG tablet TAKE 1/2 TABLET BY MOUTH EVERY NIGHT  1   Blood Glucose Monitoring Suppl (ONE TOUCH ULTRA 2) W/DEVICE KIT USE AS DIRECTED. DX: E11.22, N18.2  0   Cholecalciferol 25 MCG (1000 UT) tablet Take 2,000 Units by mouth daily.      Coenzyme  Q10 (CO Q-10) 100 MG CAPS Take 1 capsule by mouth daily.      dapagliflozin propanediol (FARXIGA) 10 MG TABS tablet Take by mouth.     glimepiride (AMARYL) 1 MG tablet TAKE 1 TABLET (1 MG TOTAL) BY MOUTH DAILY WITH BREAKFAST.  3   hydrochlorothiazide (HYDRODIURIL) 12.5 MG tablet Take 12.5 mg by mouth daily.     hydroxychloroquine (PLAQUENIL) 200 MG tablet Take 100 mg by mouth daily.      losartan (COZAAR) 50 MG tablet Take 1 tablet by mouth 2 (two) times daily.     ONE TOUCH ULTRA TEST test strip USE AS DIRECTED. CHECK CBG'S FASTING ONCE DAILY. DX: E11.22, N18.2  11   ONETOUCH DELICA LANCETS FINE MISC Use 1 Units as directed. Check CBG's fasting once daily. Dx: E11.22, N18.2     predniSONE (DELTASONE) 5 MG tablet Take by mouth.     Turmeric 400 MG CAPS Take 1 capsule by mouth 2 (two) times a day.       vitamin B-12 (CYANOCOBALAMIN) 1000 MCG tablet Take 1,000 mcg by mouth daily.     mirabegron ER (MYRBETRIQ) 50 MG TB24 tablet Take 1 tablet (50 mg total) by mouth daily. (Patient not taking: Reported on 12/16/2022) 30 tablet 11   No current facility-administered medications for this visit.     Performance status (ECOG): 1  Vitals Blood pressure 127/80, pulse 61, temperature (!) 97 F (36.1 C), temperature source Tympanic, resp. rate 17, weight 177 lb (80.3 kg), SpO2 98%.   Physical Exam Constitutional:      General: He is not in acute distress.    Appearance: Normal appearance.  HENT:     Head: Normocephalic.     Nose: Nose normal.     Mouth/Throat:     Pharynx: No oropharyngeal exudate.  Eyes:     General: No scleral icterus.    Pupils: Pupils are equal, round, and reactive to light.  Cardiovascular:     Rate and Rhythm: Normal rate.  Pulmonary:     Effort: Pulmonary effort is normal. No respiratory distress.  Abdominal:     General: Bowel sounds are normal. There is no distension.     Palpations: Abdomen is soft.  Musculoskeletal:        General: Normal range of motion.     Cervical back: Normal range of motion.  Skin:    General: Skin is warm and dry.     Findings: No erythema.  Neurological:     Mental Status: He is alert and oriented to person, place, and time. Mental status is at baseline.     Cranial Nerves: No cranial nerve deficit.     Motor: No abnormal muscle tone.  Psychiatric:        Mood and Affect: Mood and affect normal.      Labs are reviewed by me    Latest Ref Rng & Units 12/16/2022   10:12 AM 06/17/2022   11:34 AM 01/19/2022    2:34 PM  CBC  WBC 4.0 - 10.5 K/uL 3.4  4.4    Hemoglobin 13.0 - 17.0 g/dL 16.1  09.6  04.5   Hematocrit 39.0 - 52.0 % 52.8  52.9  51.5   Platelets 150 - 400 K/uL 163  168        Latest Ref Rng & Units 12/16/2022   10:12 AM 06/17/2022   11:34 AM 11/29/2021   10:05 AM  CMP  Glucose 70 - 99 mg/dL 409  811  914  BUN 8  - 23 mg/dL 38  31  33   Creatinine 0.61 - 1.24 mg/dL 9.52  8.41  3.24   Sodium 135 - 145 mmol/L 135  137  138   Potassium 3.5 - 5.1 mmol/L 3.8  3.6  3.7   Chloride 98 - 111 mmol/L 101  104  103   CO2 22 - 32 mmol/L 25  21  27    Calcium 8.9 - 10.3 mg/dL 8.9  9.0  8.8   Total Protein 6.5 - 8.1 g/dL 7.0  6.5  6.7   Total Bilirubin 0.3 - 1.2 mg/dL 0.8  0.7  0.9   Alkaline Phos 38 - 126 U/L 62  56  57   AST 15 - 41 U/L 25  34  26   ALT 0 - 44 U/L 21  25  22

## 2022-12-19 LAB — KAPPA/LAMBDA LIGHT CHAINS
Kappa free light chain: 35.7 mg/L — ABNORMAL HIGH (ref 3.3–19.4)
Kappa, lambda light chain ratio: 2.27 — ABNORMAL HIGH (ref 0.26–1.65)
Lambda free light chains: 15.7 mg/L (ref 5.7–26.3)

## 2022-12-21 LAB — MULTIPLE MYELOMA PANEL, SERUM
Albumin SerPl Elph-Mcnc: 4 g/dL (ref 2.9–4.4)
Albumin/Glob SerPl: 1.8 — ABNORMAL HIGH (ref 0.7–1.7)
Alpha 1: 0.1 g/dL (ref 0.0–0.4)
Alpha2 Glob SerPl Elph-Mcnc: 0.7 g/dL (ref 0.4–1.0)
B-Globulin SerPl Elph-Mcnc: 1 g/dL (ref 0.7–1.3)
Gamma Glob SerPl Elph-Mcnc: 0.5 g/dL (ref 0.4–1.8)
Globulin, Total: 2.3 g/dL (ref 2.2–3.9)
IgA: 167 mg/dL (ref 61–437)
IgG (Immunoglobin G), Serum: 641 mg/dL (ref 603–1613)
IgM (Immunoglobulin M), Srm: 49 mg/dL (ref 15–143)
Total Protein ELP: 6.3 g/dL (ref 6.0–8.5)

## 2023-03-20 ENCOUNTER — Telehealth: Payer: Self-pay

## 2023-03-20 DIAGNOSIS — D472 Monoclonal gammopathy: Secondary | ICD-10-CM

## 2023-03-20 NOTE — Telephone Encounter (Signed)
Received notice from Dr. Cherylann Ratel stating that pt's hgb is up to 18.2 at their office. He suspects that pt needs phleb.   Called pt, no answer. Left VM with plan to follow up with Dr. Cathie Hoops.   Please schedule and inform pt of appt:   lab/MD/ phleb this week (cbc)

## 2023-03-20 NOTE — Telephone Encounter (Addendum)
Thank you :)

## 2023-03-22 ENCOUNTER — Encounter: Payer: Self-pay | Admitting: Urgent Care

## 2023-03-27 ENCOUNTER — Inpatient Hospital Stay: Payer: Medicare Other | Attending: Oncology

## 2023-03-27 ENCOUNTER — Inpatient Hospital Stay (HOSPITAL_BASED_OUTPATIENT_CLINIC_OR_DEPARTMENT_OTHER): Payer: Medicare Other | Admitting: Oncology

## 2023-03-27 ENCOUNTER — Inpatient Hospital Stay: Payer: Medicare Other

## 2023-03-27 ENCOUNTER — Encounter: Payer: Self-pay | Admitting: Oncology

## 2023-03-27 VITALS — BP 123/77 | HR 64 | Temp 98.2°F | Resp 19 | Wt 177.6 lb

## 2023-03-27 DIAGNOSIS — D751 Secondary polycythemia: Secondary | ICD-10-CM | POA: Insufficient documentation

## 2023-03-27 DIAGNOSIS — D472 Monoclonal gammopathy: Secondary | ICD-10-CM

## 2023-03-27 DIAGNOSIS — N1832 Chronic kidney disease, stage 3b: Secondary | ICD-10-CM | POA: Diagnosis not present

## 2023-03-27 DIAGNOSIS — D696 Thrombocytopenia, unspecified: Secondary | ICD-10-CM | POA: Diagnosis present

## 2023-03-27 LAB — CBC WITH DIFFERENTIAL (CANCER CENTER ONLY)
Abs Immature Granulocytes: 0.01 10*3/uL (ref 0.00–0.07)
Basophils Absolute: 0.1 10*3/uL (ref 0.0–0.1)
Basophils Relative: 1 %
Eosinophils Absolute: 0.2 10*3/uL (ref 0.0–0.5)
Eosinophils Relative: 5 %
HCT: 52 % (ref 39.0–52.0)
Hemoglobin: 17.4 g/dL — ABNORMAL HIGH (ref 13.0–17.0)
Immature Granulocytes: 0 %
Lymphocytes Relative: 21 %
Lymphs Abs: 0.9 10*3/uL (ref 0.7–4.0)
MCH: 31.9 pg (ref 26.0–34.0)
MCHC: 33.5 g/dL (ref 30.0–36.0)
MCV: 95.2 fL (ref 80.0–100.0)
Monocytes Absolute: 0.5 10*3/uL (ref 0.1–1.0)
Monocytes Relative: 12 %
Neutro Abs: 2.7 10*3/uL (ref 1.7–7.7)
Neutrophils Relative %: 61 %
Platelet Count: 147 10*3/uL — ABNORMAL LOW (ref 150–400)
RBC: 5.46 MIL/uL (ref 4.22–5.81)
RDW: 12.8 % (ref 11.5–15.5)
WBC Count: 4.4 10*3/uL (ref 4.0–10.5)
nRBC: 0 % (ref 0.0–0.2)

## 2023-03-27 NOTE — Assessment & Plan Note (Signed)
JAK2 V617F mutation negative, with reflex to other mutations CALR, MPL, JAK 2 Ex 12-15 mutations negative. Labs reviewed and discussed with patient.   Secondary erythrocytosis, secondary to sleep apnea. Both hemoglobin and hematocrit have improved.  I will hold off phlebotomy given that hematocrit is less then 53.

## 2023-03-27 NOTE — Assessment & Plan Note (Signed)
Encourage oral hydration and avoid nephrotoxins.   

## 2023-03-27 NOTE — Progress Notes (Signed)
Hematology/Oncology Progress Note [phone] Fax:(336) [phone]    Chief Complaint: James Watson is a 82 y.o. 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 Labs are reviewed and discussed with patient. Lab Results  Component Value Date   MPROTEIN Not Observed 12/16/2022   KPAFRELGTCHN 35.7 (H) 12/16/2022   LAMBDASER 15.7 12/16/2022   KAPLAMBRATIO 2.27 (H) 12/16/2022     Continue observation.  Repeat labs in 6 months.  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 Labs reviewed and discussed with patient.   Secondary erythrocytosis, secondary to sleep apnea. Both hemoglobin and hematocrit have improved.  I will hold off phlebotomy given that hematocrit is less then 53.   CKD (chronic kidney disease) stage 3, GFR 30-59 ml/min (HCC) Encourage oral hydration and avoid nephrotoxins.    Orders Placed This Encounter  Procedures   Hemoglobin and Hematocrit (Cancer Center Only)    Standing Status:   Future    Standing Expiration Date:   03/26/2024   Follow-up in 6 months. All questions were answered. The patient knows to call the clinic with any problems, questions or concerns.  Rickard Patience, MD, PhD [facility] Hematology Oncology 03/27/2023   INTERVAL HISTORY James Watson is a 82 y.o. male who has above history reviewed by me today presents for follow up visit for erythrocytosis, thrombocytopenia. Problems and complaints are listed below: Patient reports feeling well.  he is a retired Education officer, community. Patient previously followed up by Dr.Corcoran, patient switched care to me on 03/27/23 Extensive medical record review was performed by me  No new complaints  He has chronic kidney disease.  Myeloma panel on  04/06/2016 revealed no monoclonal protein and normal immunoglobulins. SPEP on 04/17/2018 revealed no monoclonal protein.   Light chain ratio 2.29. IFE urine on 04/19/2017 revealed 618 mg protein in 24 hours (30-150).   24 hour UPEP revealed no monoclonal protein.  There was 406 mg protein/24 hours. 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  He has history of erythrocytosis.  Hemoglobin was 16.6 on 05/03/2018.  He denies any cardiopulmonary disease, tobacco use, sleep apnea or testosterone use.  CXR on 05/08/2018 revealed no acute cardiopulmonary abnormalities.  04/17/2018 patient has normal erythropoietin level 12.8, normal carbo monoxide level, JAK2 V617F mutation negative, with reflex to other mutations CALR, MPL, JAK 2 Ex 12-15 mutations negative. He was advised by Dr. Merlene Pulling to have 300 cc phlebotomy every 3 months if hemoglobin is above 16.5.  He had a negative peripheral blood work-up for primary myeloproliferative disease.  INTERVAL HISTORY James Watson is a 82 y.o. male who has above history reviewed by me today presents for follow up visit for Bence-Jones protein in urine, chronic thrombocytopenia, renal insufficiency, and erythrocytosis Patient reports feeling well.  No new complaints.  He was diagnosed with sleep apnea and has been compliantly using CPAP machine.  Review of Systems  Constitutional:  Negative for appetite change, chills, fatigue, fever and unexpected weight change.  HENT:   Negative for hearing loss and voice change.   Eyes:  Negative for eye problems and icterus.  Respiratory:  Negative for chest tightness, cough and shortness of breath.   Cardiovascular:  Negative for chest pain and leg swelling.  Gastrointestinal:  Negative for abdominal distention and abdominal pain.  Endocrine:  Negative for hot flashes.  Genitourinary:  Negative for difficulty urinating, dysuria and frequency.   Musculoskeletal:  Negative for arthralgias.  Skin:  Negative for itching  and rash.  Neurological:  Negative for light-headedness and numbness.  Hematological:  Negative for adenopathy. Does not bruise/bleed easily.  Psychiatric/Behavioral:  Negative for confusion.     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 Adolphus Birchwood    Chronic renal insufficiency    Diabetes mellitus without complication (HCC)    Hypercholesteremia    Hypertension    Osteoarthritis    Prostate cancer (HCC)    Squamous cell carcinoma    TIA (transient ischemic attack)     Past Surgical History:  Procedure Laterality Date   CATARACT EXTRACTION W/PHACO Left 01/30/2019   Procedure: CATARACT EXTRACTION PHACO AND INTRAOCULAR LENS PLACEMENT (IOC) left diabetic  panoptix lens  00:59.4  15.3  9.09;  Surgeon: Lockie Mola, MD;  Location: [facility];  Service: Ophthalmology;  Laterality: Left;  Diabetic - oral meds   CATARACT EXTRACTION W/PHACO Right 02/20/2019   Procedure: CATARACT EXTRACTION PHACO AND INTRAOCULAR LENS PLACEMENT (IOC) RIGHT DIABETIC PANOPTIX TORIC  0 ;54 11.0% 6.01;  Surgeon: Lockie Mola, MD;  Location: [facility];  Service: Ophthalmology;  Laterality: Right;   COLONOSCOPY WITH PROPOFOL N/A 07/16/2015   Procedure: COLONOSCOPY WITH PROPOFOL;  Surgeon: Scot Jun, MD;  Location: [facility] END[facility];  Service: Endoscopy;  Laterality: N/A;   COLONOSCOPY WITH PROPOFOL N/A 12/20/2021   Procedure: COLONOSCOPY WITH PROPOFOL;  Surgeon: Regis Bill, MD;  Location: [facility] ENDOSCOPY;  Service: Endoscopy;  Laterality: N/A;  DM   EYE SURGERY     left shoulder arthroscopy     mtp repair     PROSTATECTOMY     tm repair      History reviewed. No pertinent family history.  Social History:  reports that he has never smoked. He has never used smokeless tobacco. He reports current alcohol use of about 2.0 standard drinks of alcohol per week. He  reports that he does not use drugs.  Allergies:  Allergies  Allergen Reactions   Enalapril Maleate Other (See Comments)    Difficulty breathing   Hydrocodone Other (See Comments)   Hydrocodone-Acetaminophen Nausea Only    Hallucinations    Current Medications: Current Outpatient Medications  Medication Sig Dispense Refill   acetaminophen (TYLENOL) 650 MG CR tablet Take 1,300 mg by mouth daily as needed for pain.     amLODipine (NORVASC) 10 MG tablet Take 10 mg by mouth daily.      aspirin EC 81 MG tablet Take 81 mg by mouth daily.      atorvastatin (LIPITOR) 80 MG tablet TAKE 1/2 TABLET BY MOUTH EVERY NIGHT  1   Blood Glucose Monitoring Suppl (ONE TOUCH ULTRA 2) W/DEVICE KIT USE AS DIRECTED. DX: E11.22, N18.2  0   Cholecalciferol 25 MCG (1000 UT) tablet Take 2,000 Units by mouth daily.      Coenzyme Q10 (CO Q-10) 100 MG CAPS Take 1 capsule by mouth daily.      dapagliflozin propanediol (FARXIGA) 10 MG TABS tablet Take by mouth.     glimepiride (AMARYL) 1 MG tablet TAKE 1 TABLET (1 MG TOTAL) BY MOUTH DAILY WITH BREAKFAST.  3   hydrochlorothiazide (HYDRODIURIL) 12.5 MG tablet Take 12.5 mg by mouth daily.     hydroxychloroquine (PLAQUENIL) 200  MG tablet Take 100 mg by mouth daily.      losartan (COZAAR) 50 MG tablet Take 1 tablet by mouth 2 (two) times daily.     ONE TOUCH ULTRA TEST test strip USE AS DIRECTED. CHECK CBG'S FASTING ONCE DAILY. DX: E11.22, N18.2  11   ONETOUCH DELICA LANCETS FINE MISC Use 1 Units as directed. Check CBG's fasting once daily. Dx: E11.22, N18.2     Turmeric 400 MG CAPS Take 1 capsule by mouth 2 (two) times a day.      vitamin B-12 (CYANOCOBALAMIN) 1000 MCG tablet Take 1,000 mcg by mouth daily.     mirabegron ER (MYRBETRIQ) 50 MG TB24 tablet Take 1 tablet (50 mg total) by mouth daily. (Patient not taking: Reported on 12/16/2022) 30 tablet 11   No current facility-administered medications for this visit.     Performance status (ECOG): 1  Vitals Blood  pressure 123/77, pulse 64, temperature 98.2 F (36.8 C), resp. rate 19, weight 177 lb 9.6 oz (80.6 kg), SpO2 97%.   Physical Exam Constitutional:      General: He is not in acute distress.    Appearance: Normal appearance.  HENT:     Head: Normocephalic.     Nose: Nose normal.     Mouth/Throat:     Pharynx: No oropharyngeal exudate.  Eyes:     General: No scleral icterus.    Pupils: Pupils are equal, round, and reactive to light.  Cardiovascular:     Rate and Rhythm: Normal rate.  Pulmonary:     Effort: Pulmonary effort is normal. No respiratory distress.  Abdominal:     General: Bowel sounds are normal. There is no distension.     Palpations: Abdomen is soft.  Musculoskeletal:        General: Normal range of motion.     Cervical back: Normal range of motion.  Skin:    General: Skin is warm and dry.     Findings: No erythema.  Neurological:     Mental Status: He is alert and oriented to person, place, and time. Mental status is at baseline.     Cranial Nerves: No cranial nerve deficit.     Motor: No abnormal muscle tone.  Psychiatric:        Mood and Affect: Mood and affect normal.      Labs are reviewed by me    Latest Ref Rng & Units 03/27/2023    2:31 PM 12/16/2022   10:12 AM 06/17/2022   11:34 AM  CBC  WBC 4.0 - 10.5 K/uL 4.4  3.4  4.4   Hemoglobin 13.0 - 17.0 g/dL 29.5  28.4  13.2   Hematocrit 39.0 - 52.0 % 52.0  52.8  52.9   Platelets 150 - 400 K/uL 147  163  168       Latest Ref Rng & Units 12/16/2022   10:12 AM 06/17/2022   11:34 AM 11/29/2021   10:05 AM  CMP  Glucose 70 - 99 mg/dL 440  102  725   BUN 8 - 23 mg/dL 38  31  33   Creatinine 0.61 - 1.24 mg/dL 3.66  4.40  3.47   Sodium 135 - 145 mmol/L 135  137  138   Potassium 3.5 - 5.1 mmol/L 3.8  3.6  3.7   Chloride 98 - 111 mmol/L 101  104  103   CO2 22 - 32 mmol/L 25  21  27    Calcium 8.9 - 10.3 mg/dL 8.9  9.0  8.8   Total Protein 6.5 - 8.1 g/dL 7.0  6.5  6.7   Total Bilirubin 0.3 - 1.2 mg/dL 0.8   0.7  0.9   Alkaline Phos 38 - 126 U/L 62  56  57   AST 15 - 41 U/L 25  34  26   ALT 0 - 44 U/L 21  25  22

## 2023-03-27 NOTE — Assessment & Plan Note (Addendum)
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 Labs are reviewed and discussed with patient. Lab Results  Component Value Date   MPROTEIN Not Observed 12/16/2022   KPAFRELGTCHN 35.7 (H) 12/16/2022   LAMBDASER 15.7 12/16/2022   KAPLAMBRATIO 2.27 (H) 12/16/2022     Continue observation.  Repeat labs in 6 months.

## 2023-06-19 ENCOUNTER — Other Ambulatory Visit: Payer: Medicare Other

## 2023-06-19 ENCOUNTER — Ambulatory Visit: Payer: Medicare Other | Admitting: Oncology

## 2023-06-26 ENCOUNTER — Inpatient Hospital Stay: Payer: Medicare Other

## 2023-06-27 ENCOUNTER — Other Ambulatory Visit: Payer: Medicare Other

## 2023-06-28 ENCOUNTER — Inpatient Hospital Stay: Payer: Medicare Other

## 2023-06-28 ENCOUNTER — Inpatient Hospital Stay: Payer: Medicare Other | Attending: Oncology

## 2023-06-28 DIAGNOSIS — D751 Secondary polycythemia: Secondary | ICD-10-CM | POA: Insufficient documentation

## 2023-06-28 DIAGNOSIS — N183 Chronic kidney disease, stage 3 unspecified: Secondary | ICD-10-CM | POA: Diagnosis not present

## 2023-06-28 DIAGNOSIS — D472 Monoclonal gammopathy: Secondary | ICD-10-CM | POA: Diagnosis present

## 2023-06-28 LAB — CBC WITH DIFFERENTIAL (CANCER CENTER ONLY)
Abs Immature Granulocytes: 0.02 10*3/uL (ref 0.00–0.07)
Basophils Absolute: 0.1 10*3/uL (ref 0.0–0.1)
Basophils Relative: 1 %
Eosinophils Absolute: 0.2 10*3/uL (ref 0.0–0.5)
Eosinophils Relative: 5 %
HCT: 52 % (ref 39.0–52.0)
Hemoglobin: 17.7 g/dL — ABNORMAL HIGH (ref 13.0–17.0)
Immature Granulocytes: 0 %
Lymphocytes Relative: 19 %
Lymphs Abs: 0.9 10*3/uL (ref 0.7–4.0)
MCH: 32.2 pg (ref 26.0–34.0)
MCHC: 34 g/dL (ref 30.0–36.0)
MCV: 94.5 fL (ref 80.0–100.0)
Monocytes Absolute: 0.4 10*3/uL (ref 0.1–1.0)
Monocytes Relative: 8 %
Neutro Abs: 3 10*3/uL (ref 1.7–7.7)
Neutrophils Relative %: 67 %
Platelet Count: 140 10*3/uL — ABNORMAL LOW (ref 150–400)
RBC: 5.5 MIL/uL (ref 4.22–5.81)
RDW: 13.1 % (ref 11.5–15.5)
WBC Count: 4.5 10*3/uL (ref 4.0–10.5)
nRBC: 0 % (ref 0.0–0.2)

## 2023-06-28 LAB — CMP (CANCER CENTER ONLY)
ALT: 24 U/L (ref 0–44)
AST: 25 U/L (ref 15–41)
Albumin: 4.3 g/dL (ref 3.5–5.0)
Alkaline Phosphatase: 54 U/L (ref 38–126)
Anion gap: 11 (ref 5–15)
BUN: 36 mg/dL — ABNORMAL HIGH (ref 8–23)
CO2: 25 mmol/L (ref 22–32)
Calcium: 9.3 mg/dL (ref 8.9–10.3)
Chloride: 104 mmol/L (ref 98–111)
Creatinine: 2.41 mg/dL — ABNORMAL HIGH (ref 0.61–1.24)
GFR, Estimated: 26 mL/min — ABNORMAL LOW (ref >60)
Glucose, Bld: 174 mg/dL — ABNORMAL HIGH (ref 70–99)
Potassium: 3.9 mmol/L (ref 3.5–5.1)
Sodium: 140 mmol/L (ref 135–145)
Total Bilirubin: 0.8 mg/dL (ref 0.0–1.2)
Total Protein: 6.5 g/dL (ref 6.5–8.1)

## 2023-06-28 NOTE — Progress Notes (Signed)
HCT entered in note is an error. Pt HCT today was 52 and pt did not require phlebotomy today.

## 2023-06-28 NOTE — Progress Notes (Signed)
Pt HCT today was 57.8 and did not require phlebotomy today per MD orders and parameters.  Pt instructed and verbalized understanding.

## 2023-06-29 LAB — KAPPA/LAMBDA LIGHT CHAINS
Kappa free light chain: 32.4 mg/L — ABNORMAL HIGH (ref 3.3–19.4)
Kappa, lambda light chain ratio: 2.51 — ABNORMAL HIGH (ref 0.26–1.65)
Lambda free light chains: 12.9 mg/L (ref 5.7–26.3)

## 2023-07-03 LAB — MULTIPLE MYELOMA PANEL, SERUM
Albumin SerPl Elph-Mcnc: 3.9 g/dL (ref 2.9–4.4)
Albumin/Glob SerPl: 1.7 (ref 0.7–1.7)
Alpha 1: 0.1 g/dL (ref 0.0–0.4)
Alpha2 Glob SerPl Elph-Mcnc: 0.6 g/dL (ref 0.4–1.0)
B-Globulin SerPl Elph-Mcnc: 1 g/dL (ref 0.7–1.3)
Gamma Glob SerPl Elph-Mcnc: 0.6 g/dL (ref 0.4–1.8)
Globulin, Total: 2.3 g/dL (ref 2.2–3.9)
IgA: 177 mg/dL (ref 61–437)
IgG (Immunoglobin G), Serum: 604 mg/dL (ref 603–1613)
IgM (Immunoglobulin M), Srm: 50 mg/dL (ref 15–143)
Total Protein ELP: 6.2 g/dL (ref 6.0–8.5)

## 2023-07-07 ENCOUNTER — Other Ambulatory Visit: Payer: Self-pay | Admitting: Family Medicine

## 2023-07-07 DIAGNOSIS — I7121 Aneurysm of the ascending aorta, without rupture: Secondary | ICD-10-CM

## 2023-07-08 ENCOUNTER — Other Ambulatory Visit: Payer: Self-pay | Admitting: Oncology

## 2023-07-08 DIAGNOSIS — D472 Monoclonal gammopathy: Secondary | ICD-10-CM

## 2023-07-08 DIAGNOSIS — R803 Bence Jones proteinuria: Secondary | ICD-10-CM

## 2023-07-08 DIAGNOSIS — D751 Secondary polycythemia: Secondary | ICD-10-CM

## 2023-07-14 ENCOUNTER — Ambulatory Visit
Admission: RE | Admit: 2023-07-14 | Discharge: 2023-07-14 | Disposition: A | Discharge status: Home / Self Care | Payer: Medicare Other | Source: Ambulatory Visit | Attending: Family Medicine | Admitting: Family Medicine

## 2023-07-14 DIAGNOSIS — I7121 Aneurysm of the ascending aorta, without rupture: Secondary | ICD-10-CM | POA: Insufficient documentation

## 2023-07-14 LAB — POCT I-STAT CREATININE: Creatinine, Ser: 2.4 mg/dL — ABNORMAL HIGH (ref 0.61–1.24)

## 2023-07-17 ENCOUNTER — Ambulatory Visit
Admission: RE | Admit: 2023-07-17 | Discharge: 2023-07-17 | Discharge status: Home / Self Care | Payer: Medicare Other | Source: Ambulatory Visit | Attending: Family Medicine | Admitting: Family Medicine

## 2023-07-17 DIAGNOSIS — I7121 Aneurysm of the ascending aorta, without rupture: Secondary | ICD-10-CM | POA: Diagnosis not present

## 2023-07-17 LAB — POCT I-STAT CREATININE: Creatinine, Ser: 2.2 mg/dL — ABNORMAL HIGH (ref 0.61–1.24)

## 2023-07-24 ENCOUNTER — Other Ambulatory Visit: Payer: Self-pay | Admitting: Family Medicine

## 2023-07-24 DIAGNOSIS — I7121 Aneurysm of the ascending aorta, without rupture: Secondary | ICD-10-CM

## 2023-07-27 ENCOUNTER — Ambulatory Visit
Admission: RE | Admit: 2023-07-27 | Discharge: 2023-07-27 | Disposition: A | Discharge status: Home / Self Care | Payer: Medicare Other | Source: Ambulatory Visit | Attending: Family Medicine | Admitting: Family Medicine

## 2023-07-27 DIAGNOSIS — I7121 Aneurysm of the ascending aorta, without rupture: Secondary | ICD-10-CM | POA: Insufficient documentation

## 2023-07-27 LAB — POCT I-STAT CREATININE: Creatinine, Ser: 2.3 mg/dL — ABNORMAL HIGH (ref 0.61–1.24)

## 2023-08-03 ENCOUNTER — Other Ambulatory Visit: Payer: Self-pay | Admitting: Family Medicine

## 2023-08-03 DIAGNOSIS — I7121 Aneurysm of the ascending aorta, without rupture: Secondary | ICD-10-CM

## 2023-08-18 ENCOUNTER — Ambulatory Visit
Admission: RE | Admit: 2023-08-18 | Discharge: 2023-08-18 | Disposition: A | Discharge status: Home / Self Care | Payer: Medicare Other | Source: Ambulatory Visit | Attending: Family Medicine | Admitting: Family Medicine

## 2023-08-18 DIAGNOSIS — I7121 Aneurysm of the ascending aorta, without rupture: Secondary | ICD-10-CM | POA: Diagnosis present

## 2023-09-25 ENCOUNTER — Other Ambulatory Visit: Payer: Medicare Other

## 2023-09-27 ENCOUNTER — Inpatient Hospital Stay: Attending: Oncology

## 2023-09-27 DIAGNOSIS — N1832 Chronic kidney disease, stage 3b: Secondary | ICD-10-CM | POA: Diagnosis not present

## 2023-09-27 DIAGNOSIS — R803 Bence Jones proteinuria: Secondary | ICD-10-CM

## 2023-09-27 DIAGNOSIS — D472 Monoclonal gammopathy: Secondary | ICD-10-CM | POA: Diagnosis present

## 2023-09-27 DIAGNOSIS — D751 Secondary polycythemia: Secondary | ICD-10-CM

## 2023-09-27 LAB — CBC WITH DIFFERENTIAL/PLATELET
Abs Immature Granulocytes: 0.03 10*3/uL (ref 0.00–0.07)
Basophils Absolute: 0.1 10*3/uL (ref 0.0–0.1)
Basophils Relative: 1 %
Eosinophils Absolute: 0.1 10*3/uL (ref 0.0–0.5)
Eosinophils Relative: 2 %
HCT: 57.2 % — ABNORMAL HIGH (ref 39.0–52.0)
Hemoglobin: 19.5 g/dL — ABNORMAL HIGH (ref 13.0–17.0)
Immature Granulocytes: 1 %
Lymphocytes Relative: 19 %
Lymphs Abs: 1.1 10*3/uL (ref 0.7–4.0)
MCH: 32.2 pg (ref 26.0–34.0)
MCHC: 34.1 g/dL (ref 30.0–36.0)
MCV: 94.5 fL (ref 80.0–100.0)
Monocytes Absolute: 0.5 10*3/uL (ref 0.1–1.0)
Monocytes Relative: 9 %
Neutro Abs: 4.1 10*3/uL (ref 1.7–7.7)
Neutrophils Relative %: 68 %
Platelets: 176 10*3/uL (ref 150–400)
RBC: 6.05 MIL/uL — ABNORMAL HIGH (ref 4.22–5.81)
RDW: 12.9 % (ref 11.5–15.5)
WBC: 6 10*3/uL (ref 4.0–10.5)
nRBC: 0 % (ref 0.0–0.2)

## 2023-09-27 LAB — COMPREHENSIVE METABOLIC PANEL WITH GFR
ALT: 28 U/L (ref 0–44)
AST: 20 U/L (ref 15–41)
Albumin: 4.1 g/dL (ref 3.5–5.0)
Alkaline Phosphatase: 57 U/L (ref 38–126)
Anion gap: 11 (ref 5–15)
BUN: 42 mg/dL — ABNORMAL HIGH (ref 8–23)
CO2: 25 mmol/L (ref 22–32)
Calcium: 9.2 mg/dL (ref 8.9–10.3)
Chloride: 100 mmol/L (ref 98–111)
Creatinine, Ser: 1.98 mg/dL — ABNORMAL HIGH (ref 0.61–1.24)
GFR, Estimated: 33 mL/min — ABNORMAL LOW (ref >60)
Glucose, Bld: 137 mg/dL — ABNORMAL HIGH (ref 70–99)
Potassium: 3.5 mmol/L (ref 3.5–5.1)
Sodium: 136 mmol/L (ref 135–145)
Total Bilirubin: 1 mg/dL (ref 0.0–1.2)
Total Protein: 7 g/dL (ref 6.5–8.1)

## 2023-09-28 LAB — KAPPA/LAMBDA LIGHT CHAINS
Kappa free light chain: 23 mg/L — ABNORMAL HIGH (ref 3.3–19.4)
Kappa, lambda light chain ratio: 2.17 — ABNORMAL HIGH (ref 0.26–1.65)
Lambda free light chains: 10.6 mg/L (ref 5.7–26.3)

## 2023-10-02 ENCOUNTER — Encounter: Payer: Self-pay | Admitting: Oncology

## 2023-10-02 ENCOUNTER — Other Ambulatory Visit: Payer: Self-pay

## 2023-10-02 ENCOUNTER — Inpatient Hospital Stay: Payer: Medicare Other | Admitting: Oncology

## 2023-10-02 DIAGNOSIS — N1832 Chronic kidney disease, stage 3b: Secondary | ICD-10-CM

## 2023-10-02 DIAGNOSIS — D751 Secondary polycythemia: Secondary | ICD-10-CM

## 2023-10-02 DIAGNOSIS — D472 Monoclonal gammopathy: Secondary | ICD-10-CM

## 2023-10-02 DIAGNOSIS — R803 Bence Jones proteinuria: Secondary | ICD-10-CM

## 2023-10-02 NOTE — Assessment & Plan Note (Addendum)
 JAK2 V617F mutation negative, with reflex to other mutations CALR, MPL, JAK 2 Ex 12-15 mutations negative. Labs reviewed and discussed with patient.   Secondary erythrocytosis, secondary to sleep apnea. Hemoglobin and hematocrit have both increased. Hematocrit >55.  I recommend phlebotomy.  Will repeat hemoglobin prior to phlebotomy. Recommend patient to discuss with primary care physician for option of nighttime oxygen measurement.

## 2023-10-02 NOTE — Progress Notes (Signed)
 HEMATOLOGY-ONCOLOGY TeleHEALTH VISIT PROGRESS NOTE  I connected with James Watson on 10/02/23  at  2:30 PM EDT by video enabled telemedicine visit and verified that I am speaking with the correct person using two identifiers. I discussed the limitations, risks, security and privacy concerns of performing an evaluation and management service by telemedicine and the availability of in-person appointments. The patient expressed understanding and agreed to proceed.   Other persons participating in the visit and their role in the encounter:  None  Patient's location: Home  Provider's location: office Chief Complaint: Erythrocytosis, MGUS   INTERVAL HISTORY James Watson is a 83 y.o. male who has above history reviewed by me today presents for follow up visit for management of erythrocytosis and MGUS  patient called to switch his in person visit to virtual visit due to sciatic pain.  Has tried steroids.  Good control of symptoms. Review of Systems  Constitutional:  Positive for fatigue. Negative for appetite change, chills, fever and unexpected weight change.  HENT:   Negative for hearing loss and voice change.   Eyes:  Negative for eye problems and icterus.  Respiratory:  Negative for chest tightness, cough and shortness of breath.   Cardiovascular:  Negative for chest pain and leg swelling.  Gastrointestinal:  Negative for abdominal distention and abdominal pain.  Endocrine: Negative for hot flashes.  Genitourinary:  Negative for difficulty urinating, dysuria and frequency.   Musculoskeletal:  Negative for arthralgias.       Sciatic pain  Skin:  Negative for itching and rash.  Neurological:  Negative for light-headedness and numbness.  Hematological:  Negative for adenopathy. Does not bruise/bleed easily.  Psychiatric/Behavioral:  Negative for confusion.     Past Medical History:  Diagnosis Date   Adenomatous colon polyp 06/18/2015   Basal cell carcinoma    BCC (basal cell  carcinoma)    followed by Dr. Tresa Frohlich    Chronic renal insufficiency    Diabetes mellitus without complication (HCC)    Hypercholesteremia    Hypertension    Osteoarthritis    Prostate cancer (HCC)    Squamous cell carcinoma    TIA (transient ischemic attack)    Past Surgical History:  Procedure Laterality Date   CATARACT EXTRACTION W/PHACO Left 01/30/2019   Procedure: CATARACT EXTRACTION PHACO AND INTRAOCULAR LENS PLACEMENT (IOC) left diabetic  panoptix lens  00:59.4  15.3  9.09;  Surgeon: Annell Kidney, MD;  Location: [facility];  Service: Ophthalmology;  Laterality: Left;  Diabetic - oral meds   CATARACT EXTRACTION W/PHACO Right 02/20/2019   Procedure: CATARACT EXTRACTION PHACO AND INTRAOCULAR LENS PLACEMENT (IOC) RIGHT DIABETIC PANOPTIX TORIC  0 ;54 11.0% 6.01;  Surgeon: Annell Kidney, MD;  Location: [facility];  Service: Ophthalmology;  Laterality: Right;   COLONOSCOPY WITH PROPOFOL  N/A 07/16/2015   Procedure: COLONOSCOPY WITH PROPOFOL ;  Surgeon: Cassie Click, MD;  Location: [facility];  Service: Endoscopy;  Laterality: N/A;   COLONOSCOPY WITH PROPOFOL  N/A 12/20/2021   Procedure: COLONOSCOPY WITH PROPOFOL ;  Surgeon: Shane Darling, MD;  Location: [facility] ENDOSCOPY;  Service: Endoscopy;  Laterality: N/A;  DM   EYE SURGERY     left shoulder arthroscopy     mtp repair     PROSTATECTOMY     tm repair      History reviewed. No pertinent family history.  Social History   Socioeconomic History   Marital status: Married    Spouse name: Not on file   Number of children:  Not on file   Years of education: Not on file   Highest education level: Not on file  Occupational History   Not on file  Tobacco Use   Smoking status: Never   Smokeless tobacco: Never  Vaping Use   Vaping status: Never Used  Substance and Sexual Activity   Alcohol use: Yes    Alcohol/week: 2.0 standard drinks of alcohol    Types: 2 Glasses of wine per week   Drug use:  Never   Sexual activity: Not on file  Other Topics Concern   Not on file  Social History Narrative   Not on file   Social Drivers of Health   Financial Resource Strain: Low Risk  (07/01/2023)   Received from [facility]   Overall Financial Resource Strain (CARDIA)    Difficulty of Paying Living Expenses: Not hard at all  Food Insecurity: No Food Insecurity (07/01/2023)   Received from [facility]   Hunger Vital Sign    Worried About Running Out of Food in the Last Year: Never true    Ran Out of Food in the Last Year: Never true  Transportation Needs: No Transportation Needs (07/01/2023)   Received from [facility] - Transportation    In the past 12 months, has lack of transportation kept you from medical appointments or from getting medications?: No    Lack of Transportation (Non-Medical): No  Physical Activity: Not on file  Stress: Not on file  Social Connections: Not on file  Intimate Partner Violence: Not on file    Current Outpatient Medications on File Prior to Visit  Medication Sig Dispense Refill   acetaminophen  (TYLENOL ) 650 MG CR tablet Take 1,300 mg by mouth daily as needed for pain.     amLODipine (NORVASC) 10 MG tablet Take 10 mg by mouth daily.      aspirin EC 81 MG tablet Take 81 mg by mouth daily.      atorvastatin (LIPITOR) 80 MG tablet TAKE 1/2 TABLET BY MOUTH EVERY NIGHT  1   Blood Glucose Monitoring Suppl (ONE TOUCH ULTRA 2) W/DEVICE KIT USE AS DIRECTED. DX: E11.22, N18.2  0   Cholecalciferol 25 MCG (1000 UT) tablet Take 2,000 Units by mouth daily.      Coenzyme Q10 (CO Q-10) 100 MG CAPS Take 1 capsule by mouth daily.      dapagliflozin propanediol (FARXIGA) 10 MG TABS tablet Take by mouth.     gabapentin (NEURONTIN) 300 MG capsule Take 1 capsule by mouth at bedtime.     glimepiride (AMARYL) 1 MG tablet TAKE 1 TABLET (1 MG TOTAL) BY MOUTH DAILY WITH BREAKFAST.  3   hydrochlorothiazide (HYDRODIURIL)  12.5 MG tablet Take 12.5 mg by mouth daily.     hydroxychloroquine (PLAQUENIL) 200 MG tablet Take 100 mg by mouth daily.      losartan (COZAAR) 50 MG tablet Take 1 tablet by mouth 2 (two) times daily.     ONE TOUCH ULTRA TEST test strip USE AS DIRECTED. CHECK CBG'S FASTING ONCE DAILY. DX: E11.22, N18.2  11   predniSONE (DELTASONE) 10 MG tablet 4 tabs po daily x 3 days, 3 tabs po daily x 3 days, 2 tabs po daily x 3 days, 1 tab po daily x 3 days     Turmeric 400 MG CAPS Take 1 capsule by mouth 2 (two) times a day.      vitamin B-12 (CYANOCOBALAMIN ) 1000 MCG tablet Take 1,000 mcg  by mouth daily.     No current facility-administered medications on file prior to visit.    Allergies  Allergen Reactions   Enalapril Maleate Other (See Comments)    Difficulty breathing   Hydrocodone Other (See Comments)   Hydrocodone-Acetaminophen  Nausea Only    Hallucinations       Observations/Objective: There were no vitals filed for this visit. There is no height or weight on file to calculate BMI.  Physical Exam Constitutional:      General: He is not in acute distress. Neurological:     Mental Status: He is alert.     CBC    Component Value Date/Time   WBC 6.0 09/27/2023 1035   RBC 6.05 (H) 09/27/2023 1035   HGB 19.5 (H) 09/27/2023 1035   HGB 17.7 (H) 06/28/2023 1357   HGB 16.5 09/11/2012 0851   HCT 57.2 (H) 09/27/2023 1035   HCT 49.1 09/11/2012 0851   PLT 176 09/27/2023 1035   PLT 140 (L) 06/28/2023 1357   PLT 126 (L) 09/11/2012 0851   MCV 94.5 09/27/2023 1035   MCV 95 09/11/2012 0851   MCH 32.2 09/27/2023 1035   MCHC 34.1 09/27/2023 1035   RDW 12.9 09/27/2023 1035   RDW 13.4 09/11/2012 0851   LYMPHSABS 1.1 09/27/2023 1035   LYMPHSABS 0.7 (L) 09/11/2012 0851   MONOABS 0.5 09/27/2023 1035   MONOABS 0.3 09/11/2012 0851   EOSABS 0.1 09/27/2023 1035   EOSABS 0.1 09/11/2012 0851   BASOSABS 0.1 09/27/2023 1035   BASOSABS 0.0 09/11/2012 0851   BASOSABS 0 08/26/2011 1130    CMP      Component Value Date/Time   NA 136 09/27/2023 1035   NA 140 09/11/2012 0851   K 3.5 09/27/2023 1035   K 4.0 09/11/2012 0851   CL 100 09/27/2023 1035   CL 106 09/11/2012 0851   CO2 25 09/27/2023 1035   CO2 29 09/11/2012 0851   GLUCOSE 137 (H) 09/27/2023 1035   GLUCOSE 118 (H) 09/11/2012 0851   BUN 42 (H) 09/27/2023 1035   BUN 24 (H) 09/11/2012 0851   CREATININE 1.98 (H) 09/27/2023 1035   CREATININE 2.41 (H) 06/28/2023 1357   CREATININE 1.43 (H) 09/11/2012 0851   CALCIUM 9.2 09/27/2023 1035   CALCIUM 8.8 09/11/2012 0851   PROT 7.0 09/27/2023 1035   ALBUMIN 4.1 09/27/2023 1035   AST 20 09/27/2023 1035   AST 25 06/28/2023 1357   ALT 28 09/27/2023 1035   ALT 24 06/28/2023 1357   ALKPHOS 57 09/27/2023 1035   BILITOT 1.0 09/27/2023 1035   BILITOT 0.8 06/28/2023 1357   GFRNONAA 33 (L) 09/27/2023 1035   GFRNONAA 26 (L) 06/28/2023 1357   GFRNONAA 49 (L) 09/11/2012 0851   GFRAA 42 (L) 04/17/2018 1109   GFRAA 56 (L) 09/11/2012 0851     ASSESSMENT & PLAN:   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 Labs are reviewed and discussed with patient. Lab Results  Component Value Date   MPROTEIN Not Observed 06/28/2023   KPAFRELGTCHN 23.0 (H) 09/27/2023   LAMBDASER 10.6 09/27/2023   KAPLAMBRATIO 2.17 (H) 09/27/2023     Continue observation.  Repeat labs in 6 months.  CKD (chronic kidney disease) stage 3, GFR 30-59 ml/min (HCC) Encourage oral hydration and avoid nephrotoxins.    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 Labs reviewed and discussed with patient.   Secondary erythrocytosis, secondary to sleep apnea.  Hemoglobin and hematocrit have both increased. Hematocrit >55.  I recommend phlebotomy.  Will repeat hemoglobin prior to phlebotomy. Recommend patient to discuss with primary care physician for option of nighttime oxygen measurement.   Orders  Placed This Encounter  Procedures   CBC with Differential (Cancer Center Only)    Standing Status:   Future    Expected Date:   01/01/2024    Expiration Date:   10/01/2024   Hemoglobin and Hematocrit (Cancer Center Only)    Standing Status:   Future    Expected Date:   10/04/2023    Expiration Date:   10/01/2024    I discussed the assessment and treatment plan with the patient. The patient was provided an opportunity to ask questions and all were answered. The patient agreed with the plan and demonstrated an understanding of the instructions.  The patient was advised to call back or seek an in-person evaluation if the symptoms worsen or if the condition fails to improve as anticipated.  Follow-up in 3 months  Timmy Forbes, MD 10/02/2023 9:44 PM

## 2023-10-02 NOTE — Assessment & Plan Note (Signed)
 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 Labs are reviewed and discussed with patient. Lab Results  Component Value Date   MPROTEIN Not Observed 06/28/2023   KPAFRELGTCHN 23.0 (H) 09/27/2023   LAMBDASER 10.6 09/27/2023   KAPLAMBRATIO 2.17 (H) 09/27/2023     Continue observation.  Repeat labs in 6 months.

## 2023-10-02 NOTE — Assessment & Plan Note (Signed)
 Encourage oral hydration and avoid nephrotoxins.

## 2023-10-03 ENCOUNTER — Ambulatory Visit: Payer: Medicare Other | Admitting: Oncology

## 2023-10-04 LAB — IFE+PROTEIN ELECTRO, 24-HR UR
% BETA, Urine: 5.6 %
ALPHA 1 URINE: 0.9 %
Albumin, U: 88.7 %
Alpha 2, Urine: 2.6 %
GAMMA GLOBULIN URINE: 2.2 %
Total Protein, Urine-Ur/day: 932 mg/(24.h) — ABNORMAL HIGH (ref 30–150)
Total Protein, Urine: 23.9 mg/dL
Total Volume: 3900

## 2023-10-05 ENCOUNTER — Inpatient Hospital Stay

## 2023-10-05 ENCOUNTER — Inpatient Hospital Stay: Attending: Oncology

## 2023-10-05 VITALS — BP 119/67 | HR 72 | Temp 96.9°F | Resp 20

## 2023-10-05 DIAGNOSIS — D751 Secondary polycythemia: Secondary | ICD-10-CM

## 2023-10-05 DIAGNOSIS — D472 Monoclonal gammopathy: Secondary | ICD-10-CM | POA: Diagnosis present

## 2023-10-05 LAB — HEMOGLOBIN AND HEMATOCRIT (CANCER CENTER ONLY)
HCT: 55.1 % — ABNORMAL HIGH (ref 39.0–52.0)
Hemoglobin: 18.9 g/dL — ABNORMAL HIGH (ref 13.0–17.0)

## 2023-10-05 NOTE — Progress Notes (Signed)
 James Watson presents today for phlebotomy per MD orders. Phlebotomy procedure started at 1420  and ended at 1436. 300 mls removed. Patient tolerated procedure well. IV needle removed intact.

## 2023-10-05 NOTE — Patient Instructions (Signed)

## 2023-10-09 ENCOUNTER — Other Ambulatory Visit

## 2023-10-09 ENCOUNTER — Encounter

## 2023-10-13 ENCOUNTER — Other Ambulatory Visit: Payer: Self-pay | Admitting: Family Medicine

## 2023-10-13 DIAGNOSIS — M5416 Radiculopathy, lumbar region: Secondary | ICD-10-CM

## 2023-10-17 ENCOUNTER — Encounter: Payer: Self-pay | Admitting: Urgent Care

## 2023-10-20 ENCOUNTER — Ambulatory Visit
Admission: RE | Admit: 2023-10-20 | Discharge: 2023-10-20 | Disposition: A | Discharge status: Home / Self Care | Source: Ambulatory Visit | Attending: Family Medicine | Admitting: Family Medicine

## 2023-10-20 DIAGNOSIS — M5416 Radiculopathy, lumbar region: Secondary | ICD-10-CM

## 2023-10-25 ENCOUNTER — Other Ambulatory Visit

## 2023-11-01 ENCOUNTER — Other Ambulatory Visit: Payer: Self-pay | Admitting: Family Medicine

## 2023-11-01 DIAGNOSIS — N281 Cyst of kidney, acquired: Secondary | ICD-10-CM

## 2023-11-06 ENCOUNTER — Ambulatory Visit
Admission: RE | Admit: 2023-11-06 | Discharge: 2023-11-06 | Disposition: A | Discharge status: Home / Self Care | Source: Ambulatory Visit | Attending: Family Medicine | Admitting: Family Medicine

## 2023-11-06 DIAGNOSIS — N281 Cyst of kidney, acquired: Secondary | ICD-10-CM | POA: Insufficient documentation

## 2023-11-06 MED ORDER — GADOBUTROL 1 MMOL/ML IV SOLN
7.0000 mL | Freq: Once | INTRAVENOUS | Status: AC | PRN
  Administered 2023-11-06: 7 mL via INTRAVENOUS

## 2023-11-09 ENCOUNTER — Telehealth: Payer: Self-pay

## 2023-11-09 NOTE — Telephone Encounter (Signed)
-----   Message from [place] sent at 11/09/2023  2:28 PM EDT ----- Regarding: RE: REFERRAL- [facility] INTERNAL MEDICINE Spoke with pt and he stated that they are going to send him to [facility] for this  and he will keep his appts here in July as scheduled. ----- Message ----- From: Timmy Forbes, MD Sent: 11/09/2023   2:25 PM EDT To: Craven Do, RN; Glennon Lao; # Subject: [facility]        Please arrange appt in June. MD only ----- Message ----- From: Glennon Lao Sent: 11/08/2023   3:46 PM EDT To: Craven Do, RN; Ronell Coe, CMA; # Subject: REFERRAL- [facility]            REFERRAL- [facility] INTERNAL MEDICINE sent to his chart. 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 He says on referral it is a solid mass arising from the interior pole of the left kidney measuring 1.9 x 1.8 cm, consistent with a small renal cell carcinoma. His next appt. with you is 01/01/2024.Dr Wilhelmenia Harada please advise.

## 2023-12-01 ENCOUNTER — Other Ambulatory Visit (HOSPITAL_COMMUNITY): Payer: Self-pay | Admitting: Nephrology

## 2023-12-01 DIAGNOSIS — N1832 Chronic kidney disease, stage 3b: Secondary | ICD-10-CM

## 2023-12-14 ENCOUNTER — Encounter (HOSPITAL_COMMUNITY): Payer: Self-pay

## 2023-12-14 ENCOUNTER — Ambulatory Visit (HOSPITAL_COMMUNITY): Admission: RE | Admit: 2023-12-14 | Source: Ambulatory Visit

## 2024-01-01 ENCOUNTER — Encounter: Payer: Self-pay | Admitting: Oncology

## 2024-01-01 ENCOUNTER — Inpatient Hospital Stay

## 2024-01-01 ENCOUNTER — Inpatient Hospital Stay: Attending: Oncology

## 2024-01-01 ENCOUNTER — Inpatient Hospital Stay: Admitting: Oncology

## 2024-01-01 VITALS — BP 123/84 | HR 65 | Temp 96.8°F | Resp 18 | Wt 175.1 lb

## 2024-01-01 DIAGNOSIS — N289 Disorder of kidney and ureter, unspecified: Secondary | ICD-10-CM | POA: Insufficient documentation

## 2024-01-01 DIAGNOSIS — N1832 Chronic kidney disease, stage 3b: Secondary | ICD-10-CM

## 2024-01-01 DIAGNOSIS — D751 Secondary polycythemia: Secondary | ICD-10-CM | POA: Diagnosis not present

## 2024-01-01 DIAGNOSIS — D472 Monoclonal gammopathy: Secondary | ICD-10-CM | POA: Diagnosis present

## 2024-01-01 LAB — CBC WITH DIFFERENTIAL (CANCER CENTER ONLY)
Abs Immature Granulocytes: 0.01 K/uL (ref 0.00–0.07)
Basophils Absolute: 0.1 K/uL (ref 0.0–0.1)
Basophils Relative: 2 %
Eosinophils Absolute: 0.4 K/uL (ref 0.0–0.5)
Eosinophils Relative: 8 %
HCT: 50.2 % (ref 39.0–52.0)
Hemoglobin: 16.8 g/dL (ref 13.0–17.0)
Immature Granulocytes: 0 %
Lymphocytes Relative: 18 %
Lymphs Abs: 0.9 K/uL (ref 0.7–4.0)
MCH: 32.4 pg (ref 26.0–34.0)
MCHC: 33.5 g/dL (ref 30.0–36.0)
MCV: 96.9 fL (ref 80.0–100.0)
Monocytes Absolute: 0.4 K/uL (ref 0.1–1.0)
Monocytes Relative: 8 %
Neutro Abs: 3.4 K/uL (ref 1.7–7.7)
Neutrophils Relative %: 64 %
Platelet Count: 147 K/uL — ABNORMAL LOW (ref 150–400)
RBC: 5.18 MIL/uL (ref 4.22–5.81)
RDW: 13.7 % (ref 11.5–15.5)
WBC Count: 5.2 K/uL (ref 4.0–10.5)
nRBC: 0 % (ref 0.0–0.2)

## 2024-01-01 NOTE — Progress Notes (Signed)
 No phlebotomy today 01/01/2024

## 2024-01-01 NOTE — Assessment & Plan Note (Signed)
 JAK2 V617F mutation negative, with reflex to other mutations CALR, MPL, JAK 2 Ex 12-15 mutations negative. Labs reviewed and discussed with patient.   Secondary erythrocytosis, secondary to sleep apnea. Improved.  No need for phlebotomy.  This is likely due to recent weight loss.  Continue monitor.

## 2024-01-01 NOTE — Progress Notes (Signed)
 Hematology/Oncology Progress Note [phone] [phone]    Chief Complaint: James Watson is a 83 y.o. 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 Labs are reviewed and discussed with patient. Lab Results  Component Value Date   MPROTEIN Not Observed 06/28/2023   KPAFRELGTCHN 23.0 (H) 09/27/2023   LAMBDASER 10.6 09/27/2023   KAPLAMBRATIO 2.17 (H) 09/27/2023     Continue observation.  Repeat labs in September 2025.  CKD (chronic kidney disease) stage 3, GFR 30-59 ml/min (HCC) Encourage oral hydration and avoid nephrotoxins.    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 Labs reviewed and discussed with patient.   Secondary erythrocytosis, secondary to sleep apnea. Improved.  No need for phlebotomy.  This is likely due to recent weight loss.  Continue monitor.   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 Patient has establish care with [facility] oncology and was recommended active surveillance versus percutaneous ablation versus surgical removal.  Patient elected to have active surveillance.  Continue follow-up with [facility] oncology.  Orders Placed This Encounter  Procedures   CBC with Differential (Cancer Center Only)    Standing Status:   Future    Expected Date:   04/02/2024    Expiration Date:   07/01/2024   CMP (Cancer Center only)    Standing Status:   Future    Expected Date:   04/02/2024    Expiration Date:   07/01/2024   Kappa/lambda light chains    Standing Status:   Future    Expected Date:   04/02/2024    Expiration Date:   07/01/2024   Multiple Myeloma Panel (SPEP&IFE w/QIG)    Standing Status:   Future    Expected Date:   04/02/2024    Expiration Date:   07/01/2024   IFE+PROTEIN ELECTRO, 24-HR UR     Standing Status:   Future    Expected Date:   04/02/2024    Expiration Date:   07/01/2024   Follow-up in 6 months. All questions were answered. The patient knows to call the clinic with any problems, questions or concerns.  Zelphia Cap, MD, PhD [facility] Hematology Oncology 01/01/2024   INTERVAL HISTORY James Watson is a 83 y.o. male who has above history reviewed by me today presents for follow up visit for erythrocytosis, thrombocytopenia. Problems and complaints are listed below: Patient reports feeling well.  he is a retired Education officer, community. Patient previously followed up by Dr.Corcoran, patient switched care to me on 01/01/24 Extensive medical record review was performed by me  No new complaints  He has chronic kidney disease.  Myeloma panel on 04/06/2016 revealed no monoclonal protein and normal immunoglobulins. SPEP on 04/17/2018 revealed no monoclonal protein.   Light chain ratio 2.29. IFE urine on 04/19/2017 revealed 618 mg protein in 24 hours (30-150).   24 hour UPEP revealed no monoclonal protein.  There was 406 mg protein/24 hours. 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  He has history of erythrocytosis.  Hemoglobin was 16.6 on 05/03/2018.  He denies any cardiopulmonary disease, tobacco use, sleep apnea or testosterone  use.  CXR on 05/08/2018 revealed no acute cardiopulmonary abnormalities.  04/17/2018 patient has normal erythropoietin  level 12.8, normal carbo monoxide level, JAK2 V617F mutation negative, with reflex to other mutations CALR, MPL, JAK 2 Ex 12-15 mutations negative. He  was advised by Dr. Rudell to have 300 cc phlebotomy every 3 months if hemoglobin is above 16.5.  He had a negative peripheral blood work-up for primary myeloproliferative disease.  INTERVAL HISTORY James Watson is a 83 y.o. male who has above history reviewed by me today presents for follow up visit for Bence-Jones protein in urine, chronic thrombocytopenia, renal insufficiency, and  erythrocytosis Patient reports feeling well.  Patient has sleep apnea and has been compliantly using CPAP machine. Patient establish care with [facility] oncology for small kidney lesion which was incidentally found on MRI lumbar spine without contrast and further characterized on MRI abdomen with and without contrast on 11/06/2023. Patient elects to  active surveillance.  Back pain has improved.  He has lost weight due to not able to move around and decreased appetite due to back pain.  Symptom has improved now. Review of Systems  Constitutional:  Positive for unexpected weight change. Negative for appetite change, chills, fatigue and fever.  HENT:   Negative for hearing loss and voice change.   Eyes:  Negative for eye problems and icterus.  Respiratory:  Negative for chest tightness, cough and shortness of breath.   Cardiovascular:  Negative for chest pain and leg swelling.  Gastrointestinal:  Negative for abdominal distention and abdominal pain.  Endocrine: Negative for hot flashes.  Genitourinary:  Negative for difficulty urinating, dysuria and frequency.   Musculoskeletal:  Negative for arthralgias.  Skin:  Negative for itching and rash.  Neurological:  Negative for light-headedness and numbness.  Hematological:  Negative for adenopathy. Does not bruise/bleed easily.  Psychiatric/Behavioral:  Negative for confusion.     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 Dela    Chronic renal insufficiency    Diabetes mellitus without complication (HCC)    Hypercholesteremia    Hypertension    Osteoarthritis    Prostate cancer (HCC)    Squamous cell carcinoma    TIA (transient ischemic attack)     Past Surgical History:  Procedure Laterality Date   CATARACT EXTRACTION W/PHACO Left 01/30/2019   Procedure: CATARACT EXTRACTION PHACO AND INTRAOCULAR LENS PLACEMENT (IOC) left diabetic   panoptix lens  00:59.4  15.3  9.09;  Surgeon: Mittie Gaskin, MD;  Location: [facility];  Service: Ophthalmology;  Laterality: Left;  Diabetic - oral meds   CATARACT EXTRACTION W/PHACO Right 02/20/2019   Procedure: CATARACT EXTRACTION PHACO AND INTRAOCULAR LENS PLACEMENT (IOC) RIGHT DIABETIC PANOPTIX TORIC  0 ;54 11.0% 6.01;  Surgeon: Mittie Gaskin, MD;  Location: [facility];  Service: Ophthalmology;  Laterality: Right;   COLONOSCOPY WITH PROPOFOL  N/A 07/16/2015   Procedure: COLONOSCOPY WITH PROPOFOL ;  Surgeon: Lamar ONEIDA Holmes, MD;  Location: [facility] ENDOSCOPY;  Service: Endoscopy;  Laterality: N/A;   COLONOSCOPY WITH PROPOFOL  N/A 12/20/2021   Procedure: COLONOSCOPY WITH PROPOFOL ;  Surgeon: Maryruth Ole ONEIDA, MD;  Location: [facility] ENDOSCOPY;  Service: Endoscopy;  Laterality: N/A;  DM   EYE SURGERY     left shoulder arthroscopy     mtp repair     PROSTATECTOMY     tm repair      History reviewed. No pertinent family history.  Social History:  reports that he has never smoked. He has never used smokeless tobacco. He reports current alcohol use of about 2.0 standard drinks of alcohol per week. He reports that he does not use drugs.  Allergies:  Allergies  Allergen Reactions   Enalapril Maleate Other (See Comments)    Difficulty breathing   Hydrocodone Other (See Comments)   Hydrocodone-Acetaminophen  Nausea Only    Hallucinations    Current Medications: Current Outpatient Medications  Medication Sig Dispense Refill   acetaminophen  (TYLENOL ) 650 MG CR tablet Take 1,300 mg by mouth daily as needed for pain.     amLODipine (NORVASC) 10 MG tablet Take 10 mg by mouth daily.      aspirin EC 81 MG tablet Take 81 mg by mouth daily.      atorvastatin (LIPITOR) 80 MG tablet TAKE 1/2 TABLET BY MOUTH EVERY NIGHT  1   Blood Glucose Monitoring Suppl (ONE TOUCH ULTRA 2) W/DEVICE KIT USE AS DIRECTED. DX: E11.22, N18.2  0   Cholecalciferol 25 MCG (1000 UT) tablet Take 2,000  Units by mouth daily.      Coenzyme Q10 (CO Q-10) 100 MG CAPS Take 1 capsule by mouth daily.      dapagliflozin propanediol (FARXIGA) 10 MG TABS tablet Take by mouth.     glimepiride (AMARYL) 1 MG tablet TAKE 1 TABLET (1 MG TOTAL) BY MOUTH DAILY WITH BREAKFAST.  3   hydrochlorothiazide (HYDRODIURIL) 12.5 MG tablet Take 12.5 mg by mouth daily.     hydroxychloroquine (PLAQUENIL) 200 MG tablet Take 100 mg by mouth daily.      losartan (COZAAR) 50 MG tablet Take 1 tablet by mouth 2 (two) times daily.     ONE TOUCH ULTRA TEST test strip USE AS DIRECTED. CHECK CBG'S FASTING ONCE DAILY. DX: E11.22, N18.2  11   Turmeric 400 MG CAPS Take 1 capsule by mouth 2 (two) times a day.      vitamin B-12 (CYANOCOBALAMIN ) 1000 MCG tablet Take 1,000 mcg by mouth daily.     No current facility-administered medications for this visit.     Performance status (ECOG): 1  Vitals Blood pressure 123/84, pulse 65, temperature (!) 96.8 F (36 C), resp. rate 18, weight 175 lb 1.6 oz (79.4 kg).   Physical Exam Constitutional:      General: He is not in acute distress.    Appearance: Normal appearance.  HENT:     Head: Normocephalic.     Nose: Nose normal.     Mouth/Throat:     Pharynx: No oropharyngeal exudate.  Eyes:     General: No scleral icterus.    Pupils: Pupils are equal, round, and reactive to light.  Cardiovascular:     Rate and Rhythm: Normal rate.  Pulmonary:     Effort: Pulmonary effort is normal. No respiratory distress.  Abdominal:     General: Bowel sounds are normal. There is no distension.     Palpations: Abdomen is soft.  Musculoskeletal:        General: Normal range of motion.     Cervical back: Normal range of motion.  Skin:    General: Skin is warm and dry.     Findings: No erythema.  Neurological:     Mental Status: He is alert and oriented to person, place, and time. Mental status is at baseline.     Cranial Nerves: No cranial nerve deficit.     Motor: No abnormal muscle tone.   Psychiatric:        Mood and Affect: Mood and affect normal.      Labs are reviewed by me    Latest Ref Rng & Units 01/01/2024    1:07 PM 10/05/2023    1:34  PM 09/27/2023   10:35 AM  CBC  WBC 4.0 - 10.5 K/uL 5.2   6.0   Hemoglobin 13.0 - 17.0 g/dL 83.1  81.0  80.4   Hematocrit 39.0 - 52.0 % 50.2  55.1  57.2   Platelets 150 - 400 K/uL 147   176       Latest Ref Rng & Units 09/27/2023   10:35 AM 07/27/2023    2:39 PM 07/17/2023    2:52 PM  CMP  Glucose 70 - 99 mg/dL 862     BUN 8 - 23 mg/dL 42     Creatinine 9.38 - 1.24 mg/dL 8.01  7.69  7.79   Sodium 135 - 145 mmol/L 136     Potassium 3.5 - 5.1 mmol/L 3.5     Chloride 98 - 111 mmol/L 100     CO2 22 - 32 mmol/L 25     Calcium 8.9 - 10.3 mg/dL 9.2     Total Protein 6.5 - 8.1 g/dL 7.0     Total Bilirubin 0.0 - 1.2 mg/dL 1.0     Alkaline Phos 38 - 126 U/L 57     AST 15 - 41 U/L 20     ALT 0 - 44 U/L 28

## 2024-01-01 NOTE — Assessment & Plan Note (Signed)
 Encourage oral hydration and avoid nephrotoxins.

## 2024-01-01 NOTE — Assessment & Plan Note (Signed)
 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 Patient has establish care with [facility] oncology and was recommended active surveillance versus percutaneous ablation versus surgical removal.  Patient elected to have active surveillance.  Continue follow-up with [facility] oncology.

## 2024-01-01 NOTE — Assessment & Plan Note (Addendum)
 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 Labs are reviewed and discussed with patient. Lab Results  Component Value Date   MPROTEIN Not Observed 06/28/2023   KPAFRELGTCHN 23.0 (H) 09/27/2023   LAMBDASER 10.6 09/27/2023   KAPLAMBRATIO 2.17 (H) 09/27/2023     Continue observation.  Repeat labs in September 2025.

## 2024-02-12 ENCOUNTER — Encounter

## 2024-04-01 ENCOUNTER — Inpatient Hospital Stay

## 2024-04-01 ENCOUNTER — Inpatient Hospital Stay: Attending: Oncology

## 2024-04-01 DIAGNOSIS — D472 Monoclonal gammopathy: Secondary | ICD-10-CM | POA: Diagnosis present

## 2024-04-01 LAB — CMP (CANCER CENTER ONLY)
ALT: 22 U/L (ref 0–44)
AST: 28 U/L (ref 15–41)
Albumin: 4 g/dL (ref 3.5–5.0)
Alkaline Phosphatase: 58 U/L (ref 38–126)
Anion gap: 10 (ref 5–15)
BUN: 32 mg/dL — ABNORMAL HIGH (ref 8–23)
CO2: 25 mmol/L (ref 22–32)
Calcium: 8.9 mg/dL (ref 8.9–10.3)
Chloride: 100 mmol/L (ref 98–111)
Creatinine: 2.16 mg/dL — ABNORMAL HIGH (ref 0.61–1.24)
GFR, Estimated: 29 mL/min — ABNORMAL LOW (ref >60)
Glucose, Bld: 164 mg/dL — ABNORMAL HIGH (ref 70–99)
Potassium: 3.7 mmol/L (ref 3.5–5.1)
Sodium: 135 mmol/L (ref 135–145)
Total Bilirubin: 0.8 mg/dL (ref 0.0–1.2)
Total Protein: 6.5 g/dL (ref 6.5–8.1)

## 2024-04-01 LAB — CBC WITH DIFFERENTIAL (CANCER CENTER ONLY)
Abs Immature Granulocytes: 0.01 K/uL (ref 0.00–0.07)
Basophils Absolute: 0.1 K/uL (ref 0.0–0.1)
Basophils Relative: 1 %
Eosinophils Absolute: 0.2 K/uL (ref 0.0–0.5)
Eosinophils Relative: 4 %
HCT: 53.3 % — ABNORMAL HIGH (ref 39.0–52.0)
Hemoglobin: 17.7 g/dL — ABNORMAL HIGH (ref 13.0–17.0)
Immature Granulocytes: 0 %
Lymphocytes Relative: 15 %
Lymphs Abs: 0.8 K/uL (ref 0.7–4.0)
MCH: 31.4 pg (ref 26.0–34.0)
MCHC: 33.2 g/dL (ref 30.0–36.0)
MCV: 94.7 fL (ref 80.0–100.0)
Monocytes Absolute: 0.5 K/uL (ref 0.1–1.0)
Monocytes Relative: 10 %
Neutro Abs: 3.9 K/uL (ref 1.7–7.7)
Neutrophils Relative %: 70 %
Platelet Count: 139 K/uL — ABNORMAL LOW (ref 150–400)
RBC: 5.63 MIL/uL (ref 4.22–5.81)
RDW: 12.6 % (ref 11.5–15.5)
WBC Count: 5.5 K/uL (ref 4.0–10.5)
nRBC: 0 % (ref 0.0–0.2)

## 2024-04-02 LAB — KAPPA/LAMBDA LIGHT CHAINS
Kappa free light chain: 24.8 mg/L — ABNORMAL HIGH (ref 3.3–19.4)
Kappa, lambda light chain ratio: 1.81 — ABNORMAL HIGH (ref 0.26–1.65)
Lambda free light chains: 13.7 mg/L (ref 5.7–26.3)

## 2024-04-03 ENCOUNTER — Other Ambulatory Visit: Payer: Self-pay

## 2024-04-03 DIAGNOSIS — D472 Monoclonal gammopathy: Secondary | ICD-10-CM

## 2024-04-04 LAB — MULTIPLE MYELOMA PANEL, SERUM
Albumin SerPl Elph-Mcnc: 3.7 g/dL (ref 2.9–4.4)
Albumin/Glob SerPl: 1.7 (ref 0.7–1.7)
Alpha 1: 0.2 g/dL (ref 0.0–0.4)
Alpha2 Glob SerPl Elph-Mcnc: 0.7 g/dL (ref 0.4–1.0)
B-Globulin SerPl Elph-Mcnc: 0.9 g/dL (ref 0.7–1.3)
Gamma Glob SerPl Elph-Mcnc: 0.6 g/dL (ref 0.4–1.8)
Globulin, Total: 2.3 g/dL (ref 2.2–3.9)
IgA: 143 mg/dL (ref 61–437)
IgG (Immunoglobin G), Serum: 581 mg/dL — ABNORMAL LOW (ref 603–1613)
IgM (Immunoglobulin M), Srm: 51 mg/dL (ref 15–143)
Total Protein ELP: 6 g/dL (ref 6.0–8.5)

## 2024-04-08 LAB — IFE+PROTEIN ELECTRO, 24-HR UR
% BETA, Urine: 8.5 %
ALPHA 1 URINE: 3.1 %
Albumin, U: 81.6 %
Alpha 2, Urine: 4.5 %
GAMMA GLOBULIN URINE: 2.3 %
Total Protein, Urine-Ur/day: 237 mg/(24.h) — ABNORMAL HIGH (ref 30–150)
Total Protein, Urine: 7.9 mg/dL
Total Volume: 3000

## 2024-04-10 ENCOUNTER — Encounter: Payer: Self-pay | Admitting: Oncology

## 2024-04-10 ENCOUNTER — Inpatient Hospital Stay: Attending: Oncology | Admitting: Oncology

## 2024-04-10 VITALS — BP 126/82 | HR 66 | Temp 96.6°F | Resp 18 | Wt 175.8 lb

## 2024-04-10 DIAGNOSIS — N1832 Chronic kidney disease, stage 3b: Secondary | ICD-10-CM | POA: Diagnosis not present

## 2024-04-10 DIAGNOSIS — D472 Monoclonal gammopathy: Secondary | ICD-10-CM | POA: Diagnosis not present

## 2024-04-10 DIAGNOSIS — D751 Secondary polycythemia: Secondary | ICD-10-CM

## 2024-04-10 DIAGNOSIS — D696 Thrombocytopenia, unspecified: Secondary | ICD-10-CM | POA: Diagnosis not present

## 2024-04-10 DIAGNOSIS — N289 Disorder of kidney and ureter, unspecified: Secondary | ICD-10-CM

## 2024-04-10 NOTE — Assessment & Plan Note (Addendum)
 Bence-Jones protein in the urine was detected on 04/19/2017, and 04/20/2018 Labs are reviewed and discussed with patient. Lab Results  Component Value Date   MPROTEIN Not Observed 04/01/2024   KPAFRELGTCHN 24.8 (H) 04/01/2024   LAMBDASER 13.7 04/01/2024   KAPLAMBRATIO 1.81 (H) 04/01/2024   24 hour UPEP was negative the Bence Jones protein.  Possibly due to steroid use.  Continue observation. Repeat labs in 1 year

## 2024-04-10 NOTE — Assessment & Plan Note (Signed)
 JAK2 V617F mutation negative, with reflex to other mutations CALR, MPL, JAK 2 Ex 12-15 mutations negative. Labs reviewed and discussed with patient.   Secondary erythrocytosis, secondary to sleep apnea. No need for phlebotomy.   Continue monitor.

## 2024-04-10 NOTE — Assessment & Plan Note (Signed)
 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 Patient has establish care with [facility] oncology and was recommended active surveillance versus percutaneous ablation versus surgical removal.  Patient elected to have active surveillance.  Continue follow-up with [facility] oncology.

## 2024-04-10 NOTE — Assessment & Plan Note (Signed)
 Encourage oral hydration and avoid nephrotoxins.

## 2024-04-10 NOTE — Assessment & Plan Note (Signed)
 Mild thrombocytopenia.  Check B12 and folate.  Recommend decrease alcohol intake

## 2024-04-10 NOTE — Progress Notes (Signed)
 Hematology/Oncology Progress Note [phone] [phone]    Chief Complaint: James Watson is a 83 y.o. 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 Lab Results  Component Value Date   MPROTEIN Not Observed 04/01/2024   KPAFRELGTCHN 24.8 (H) 04/01/2024   LAMBDASER 13.7 04/01/2024   KAPLAMBRATIO 1.81 (H) 04/01/2024   24 hour UPEP was negative the Bence Jones protein.  Possibly due to steroid use.  Continue observation. Repeat labs in 1 year  CKD (chronic kidney disease) stage 3, GFR 30-59 ml/min (HCC) Encourage oral hydration and avoid nephrotoxins.    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 Labs reviewed and discussed with patient.   Secondary erythrocytosis, secondary to sleep apnea. No need for phlebotomy.   Continue monitor.   Thrombocytopenia Mild thrombocytopenia.  Check B12 and folate.  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 Patient has establish care with [facility] oncology and was recommended active surveillance versus percutaneous ablation versus surgical removal.  Patient elected to have active surveillance.  Continue follow-up with [facility] oncology.  Orders Placed This Encounter  Procedures   CBC (Cancer Center Only)    Standing Status:   Future    Expected Date:   10/08/2024    Expiration Date:   01/06/2025   Vitamin B12    Standing Status:   Future    Expected Date:   10/08/2024    Expiration Date:   01/06/2025   Folate    Standing Status:   Future    Expected Date:   10/08/2024    Expiration Date:   01/06/2025   Immature Platelet Fraction    Standing Status:   Future    Expected  Date:   10/08/2024    Expiration Date:   01/06/2025   Follow-up in 6 months. All questions were answered. The patient knows to call the clinic with any problems, questions or concerns.  Zelphia Cap, MD, PhD [facility] Oncology 04/10/2024   INTERVAL HISTORY James Watson is a 83 y.o. male who has above history reviewed by me today presents for follow up visit for erythrocytosis, thrombocytopenia. Problems and complaints are listed below: Patient reports feeling well.  he is a retired education officer, community. Patient previously followed up by Dr.Corcoran, patient switched care to me on 04/10/24 Extensive medical record review was performed by me  He has chronic kidney disease.  Myeloma panel on 04/06/2016 revealed no monoclonal protein and normal immunoglobulins. SPEP on 04/17/2018 revealed no monoclonal protein.   Light chain ratio 2.29. IFE urine on 04/19/2017 revealed 618 mg protein in 24 hours (30-150).   24 hour UPEP revealed no monoclonal protein.  There was 406 mg protein/24 hours. 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  He has history of erythrocytosis.  Hemoglobin was 16.6 on 05/03/2018.  He denies any cardiopulmonary disease, tobacco use, sleep apnea or testosterone  use.  CXR on 05/08/2018 revealed no acute cardiopulmonary abnormalities.  04/17/2018 patient has normal erythropoietin  level 12.8, normal carbo monoxide level, JAK2 V617F mutation negative, with reflex to other mutations CALR, MPL, JAK 2 Ex 12-15 mutations negative. He was advised by Dr. Rudell to have 300 cc phlebotomy every 3 months if hemoglobin is above 16.5.  He had a negative peripheral blood work-up for primary myeloproliferative disease.  Patient establish care with [facility] oncology for small kidney lesion which was incidentally found on MRI lumbar spine without contrast and further characterized on MRI abdomen with and without contrast on 11/06/2023.Patient elects to  active surveillance.  INTERVAL HISTORY James Watson is a 83 y.o. male who has above history reviewed by me today presents for follow up visit for Bence-Jones protein in urine, chronic thrombocytopenia, renal insufficiency, and erythrocytosis Patient reports feeling well.  Patient has sleep apnea and has been compliantly using CPAP machine.  Back pain has improved after oral steroid and spine injections.  . Review of Systems  Constitutional:  Negative for appetite change, chills, fatigue, fever and unexpected weight change.  HENT:   Negative for hearing loss and voice change.   Eyes:  Negative for eye problems and icterus.  Respiratory:  Negative for chest tightness, cough and shortness of breath.   Cardiovascular:  Negative for chest pain and leg swelling.  Gastrointestinal:  Negative for abdominal distention and abdominal pain.  Endocrine: Negative for hot flashes.  Genitourinary:  Negative for difficulty urinating, dysuria and frequency.   Musculoskeletal:  Negative for arthralgias.  Skin:  Negative for itching and rash.  Neurological:  Negative for light-headedness and numbness.  Hematological:  Negative for adenopathy. Does not bruise/bleed easily.  Psychiatric/Behavioral:  Negative for confusion.     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 Dela    Chronic renal insufficiency    Diabetes mellitus without complication (HCC)    Hypercholesteremia    Hypertension    Osteoarthritis    Prostate cancer (HCC)    Squamous cell carcinoma    TIA (transient ischemic attack)     Past Surgical History:  Procedure Laterality Date   CATARACT EXTRACTION W/PHACO Left 01/30/2019   Procedure: CATARACT EXTRACTION PHACO AND INTRAOCULAR LENS PLACEMENT (IOC) left diabetic  panoptix lens  00:59.4  15.3  9.09;  Surgeon: Mittie Gaskin, MD;  Location: [facility];  Service: Ophthalmology;  Laterality: Left;   Diabetic - oral meds   CATARACT EXTRACTION W/PHACO Right 02/20/2019   Procedure: CATARACT EXTRACTION PHACO AND INTRAOCULAR LENS PLACEMENT (IOC) RIGHT DIABETIC PANOPTIX TORIC  0 ;54 11.0% 6.01;  Surgeon: Mittie Gaskin, MD;  Location: [facility];  Service: Ophthalmology;  Laterality: Right;   COLONOSCOPY WITH PROPOFOL  N/A 07/16/2015   Procedure: COLONOSCOPY WITH PROPOFOL ;  Surgeon: Lamar ONEIDA Holmes, MD;  Location: [facility];  Service: Endoscopy;  Laterality: N/A;   COLONOSCOPY WITH PROPOFOL  N/A 12/20/2021   Procedure: COLONOSCOPY WITH PROPOFOL ;  Surgeon: Maryruth Ole ONEIDA, MD;  Location: [facility] ENDOSCOPY;  Service: Endoscopy;  Laterality: N/A;  DM   EYE SURGERY     left shoulder arthroscopy     mtp repair     PROSTATECTOMY     tm repair      History reviewed. No pertinent family history.  Social History:  reports that he has never smoked. He has never used smokeless tobacco. He reports current alcohol use of about 2.0 standard drinks of alcohol per week. He reports that he does not use drugs.  Allergies:  Allergies  Allergen Reactions   Enalapril Maleate Other (See Comments)    Difficulty breathing   Hydrocodone Other (See Comments)   Hydrocodone-Acetaminophen  Nausea Only    Hallucinations  Current Medications: Current Outpatient Medications  Medication Sig Dispense Refill   acetaminophen  (TYLENOL ) 650 MG CR tablet Take 1,300 mg by mouth daily as needed for pain.     amLODipine (NORVASC) 10 MG tablet Take 10 mg by mouth daily.      aspirin EC 81 MG tablet Take 81 mg by mouth daily.      atorvastatin (LIPITOR) 80 MG tablet TAKE 1/2 TABLET BY MOUTH EVERY NIGHT  1   Blood Glucose Monitoring Suppl (ONE TOUCH ULTRA 2) W/DEVICE KIT USE AS DIRECTED. DX: E11.22, N18.2  0   Cholecalciferol 25 MCG (1000 UT) tablet Take 2,000 Units by mouth daily.      Coenzyme Q10 (CO Q-10) 100 MG CAPS Take 1 capsule by mouth daily.      dapagliflozin propanediol (FARXIGA) 10 MG TABS  tablet Take by mouth.     glimepiride (AMARYL) 1 MG tablet TAKE 1 TABLET (1 MG TOTAL) BY MOUTH DAILY WITH BREAKFAST.  3   hydrochlorothiazide (HYDRODIURIL) 12.5 MG tablet Take 12.5 mg by mouth daily.     hydroxychloroquine (PLAQUENIL) 200 MG tablet Take 100 mg by mouth daily.      losartan (COZAAR) 50 MG tablet Take 1 tablet by mouth 2 (two) times daily.     ONE TOUCH ULTRA TEST test strip USE AS DIRECTED. CHECK CBG'S FASTING ONCE DAILY. DX: E11.22, N18.2  11   Turmeric 400 MG CAPS Take 1 capsule by mouth 2 (two) times a day.      vitamin B-12 (CYANOCOBALAMIN ) 1000 MCG tablet Take 1,000 mcg by mouth daily.     No current facility-administered medications for this visit.     Performance status (ECOG): 1  Vitals Blood pressure 126/82, pulse 66, temperature (!) 96.6 F (35.9 C), temperature source Tympanic, resp. rate 18, weight 175 lb 12.8 oz (79.7 kg), SpO2 96%.   Physical Exam Constitutional:      General: He is not in acute distress.    Appearance: Normal appearance.  HENT:     Mouth/Throat:     Pharynx: No oropharyngeal exudate.  Eyes:     General: No scleral icterus.    Pupils: Pupils are equal, round, and reactive to light.  Cardiovascular:     Rate and Rhythm: Normal rate.  Pulmonary:     Effort: Pulmonary effort is normal. No respiratory distress.  Abdominal:     General: Bowel sounds are normal. There is no distension.     Palpations: Abdomen is soft.  Musculoskeletal:        General: Normal range of motion.     Cervical back: Normal range of motion.  Skin:    General: Skin is warm and dry.     Findings: No erythema.  Neurological:     Mental Status: He is alert and oriented to person, place, and time. Mental status is at baseline.     Cranial Nerves: No cranial nerve deficit.     Motor: No abnormal muscle tone.  Psychiatric:        Mood and Affect: Mood and affect normal.      Labs are reviewed by me    Latest Ref Rng & Units 04/01/2024    1:50 PM  01/01/2024    1:07 PM 10/05/2023    1:34 PM  CBC  WBC 4.0 - 10.5 K/uL 5.5  5.2    Hemoglobin 13.0 - 17.0 g/dL 82.2  83.1  81.0   Hematocrit 39.0 - 52.0 % 53.3  50.2  55.1  Platelets 150 - 400 K/uL 139  147        Latest Ref Rng & Units 04/01/2024    1:50 PM 09/27/2023   10:35 AM 07/27/2023    2:39 PM  CMP  Glucose 70 - 99 mg/dL 835  862    BUN 8 - 23 mg/dL 32  42    Creatinine 9.38 - 1.24 mg/dL 7.83  8.01  7.69   Sodium 135 - 145 mmol/L 135  136    Potassium 3.5 - 5.1 mmol/L 3.7  3.5    Chloride 98 - 111 mmol/L 100  100    CO2 22 - 32 mmol/L 25  25    Calcium 8.9 - 10.3 mg/dL 8.9  9.2    Total Protein 6.5 - 8.1 g/dL 6.5  7.0    Total Bilirubin 0.0 - 1.2 mg/dL 0.8  1.0    Alkaline Phos 38 - 126 U/L 58  57    AST 15 - 41 U/L 28  20    ALT 0 - 44 U/L 22  28

## 2024-10-07 ENCOUNTER — Inpatient Hospital Stay

## 2024-10-14 ENCOUNTER — Inpatient Hospital Stay: Admitting: Oncology
